# Patient Record
Sex: Male | Born: 1965 | Race: White | Hispanic: No | Marital: Married | Smoking: Former smoker
Health system: Southern US, Community
[De-identification: ages and names within clinical notes are randomized; demographics above are authoritative.]

## PROBLEM LIST (undated history)

## (undated) DIAGNOSIS — E785 Hyperlipidemia, unspecified: Secondary | ICD-10-CM

## (undated) DIAGNOSIS — I10 Essential (primary) hypertension: Secondary | ICD-10-CM

## (undated) HISTORY — PX: HERNIA REPAIR: SHX51

## (undated) HISTORY — DX: Hyperlipidemia, unspecified: E78.5

## (undated) HISTORY — PX: ANKLE FRACTURE SURGERY: SHX122

## (undated) HISTORY — DX: Essential (primary) hypertension: I10

---

## 1998-09-23 DIAGNOSIS — I10 Essential (primary) hypertension: Secondary | ICD-10-CM | POA: Insufficient documentation

## 1998-09-23 DIAGNOSIS — E7800 Pure hypercholesterolemia, unspecified: Secondary | ICD-10-CM

## 1998-09-23 HISTORY — DX: Pure hypercholesterolemia, unspecified: E78.00

## 2008-09-23 DIAGNOSIS — K649 Unspecified hemorrhoids: Secondary | ICD-10-CM

## 2008-09-23 DIAGNOSIS — I499 Cardiac arrhythmia, unspecified: Secondary | ICD-10-CM

## 2008-09-23 HISTORY — DX: Cardiac arrhythmia, unspecified: I49.9

## 2008-09-23 HISTORY — DX: Unspecified hemorrhoids: K64.9

## 2020-03-13 ENCOUNTER — Ambulatory Visit: Payer: 59 | Admitting: Family Medicine

## 2020-03-13 ENCOUNTER — Encounter: Payer: Self-pay | Admitting: Family Medicine

## 2020-03-13 ENCOUNTER — Other Ambulatory Visit: Payer: Self-pay

## 2020-03-13 VITALS — BP 148/98 | HR 67 | Temp 97.3°F | Ht 70.0 in | Wt 204.0 lb

## 2020-03-13 DIAGNOSIS — M7711 Lateral epicondylitis, right elbow: Secondary | ICD-10-CM

## 2020-03-13 DIAGNOSIS — K649 Unspecified hemorrhoids: Secondary | ICD-10-CM

## 2020-03-13 DIAGNOSIS — N4822 Cellulitis of corpus cavernosum and penis: Secondary | ICD-10-CM | POA: Insufficient documentation

## 2020-03-13 DIAGNOSIS — A6001 Herpesviral infection of penis: Secondary | ICD-10-CM | POA: Diagnosis not present

## 2020-03-13 MED ORDER — CEPHALEXIN 500 MG PO CAPS: 500.0000 mg | ORAL_CAPSULE | Freq: Three times a day (TID) | ORAL | 0 refills | Status: AC

## 2020-03-13 NOTE — Progress Notes (Addendum)
New Patient Office Visit  Subjective:  Patient ID: Phillip Gay, male    DOB: [birth year]  Age: 54 y.o. MRN: [id number]  CC:  Chief Complaint  Patient presents with  . Establish Care    New patient, possble hernia on right side of groan, right elbow pain that come and go. rocommendation for protologist, would like scab on penis checked.     HPI Phillip Gay presents for evaluation right elbow pain, rash on his penis, evaluation of inguinal hernias and intermittent hemorrhoid issues.  Patient is right-hand dominant with a 1 year history of right elbow pain.  This started after he was trimming some bushes.  He has had hernia repair but would like to be checked again.  He has had a rash on his penis for about a week now.  It was a little sore.  He has been married for 20 years and sexually active with his wife only.  no reason to think she has been active with anyone else.  History of hemorrhoids that have been injected by a proctologist in the past.  Past Medical History:  Diagnosis Date  . Hyperlipidemia   . Hypertension       Family History  Problem Relation Age of Onset  . Hypertension Father   . Cancer Father     Social History   Socioeconomic History  . Marital status: Married    Spouse name: Not on file  . Number of children: Not on file  . Years of education: Not on file  . Highest education level: Not on file  Occupational History  . Not on file  Tobacco Use  . Smoking status: Former Research scientist (life sciences)  . Smokeless tobacco: Never Used  Vaping Use  . Vaping Use: Never used  Substance and Sexual Activity  . Alcohol use: Yes    Alcohol/week: 2.0 standard drinks    Types: 1 Cans of beer, 1 Standard drinks or equivalent per week  . Drug use: Never  . Sexual activity: Yes  Other Topics Concern  . Not on file  Social History Narrative  . Not on file   Social Determinants of Health   Financial Resource Strain:   . Difficulty of Paying Living Expenses:    Food Insecurity:   . Worried About Charity fundraiser in the Last Year:   . Arboriculturist in the Last Year:   Transportation Needs:   . Film/video editor (Medical):   Marland Kitchen Lack of Transportation (Non-Medical):   Physical Activity:   . Days of Exercise per Week:   . Minutes of Exercise per Session:   Stress:   . Feeling of Stress :   Social Connections:   . Frequency of Communication with Friends and Family:   . Frequency of Social Gatherings with Friends and Family:   . Attends Religious Services:   . Active Member of Clubs or Organizations:   . Attends Archivist Meetings:   Marland Kitchen Marital Status:   Intimate Partner Violence:   . Fear of Current or Ex-Partner:   . Emotionally Abused:   Marland Kitchen Physically Abused:   . Sexually Abused:     ROS Review of Systems  Constitutional: Negative.   HENT: Negative.   Respiratory: Negative.   Cardiovascular: Negative.   Gastrointestinal: Negative.  Negative for anal bleeding, blood in stool and constipation.  Genitourinary: Negative.  Negative for difficulty urinating, discharge and frequency.  Musculoskeletal: Positive for arthralgias.  Skin: Negative for pallor  and rash.  Neurological: Negative for weakness and numbness.    Objective:   Today's Vitals: BP (!) 148/98   Pulse 67   Temp (!) 97.3 F (36.3 C) (Tympanic)   Ht 5\' 10"  (1.778 m)   Wt 204 lb (92.5 kg)   SpO2 94%   BMI 29.27 kg/m   Physical Exam Vitals and nursing note reviewed.  Constitutional:      General: He is not in acute distress.    Appearance: Normal appearance. He is normal weight. He is not ill-appearing or toxic-appearing.  HENT:     Head: Normocephalic and atraumatic.  Eyes:     Extraocular Movements: Extraocular movements intact.     Conjunctiva/sclera: Conjunctivae normal.  Neck:     Trachea: Trachea normal.  Pulmonary:     Effort: Pulmonary effort is normal.  Abdominal:     Hernia: There is no hernia in the left inguinal area or right  inguinal area.  Genitourinary:    Penis: Lesions present. No hypospadias, erythema, tenderness, discharge or swelling.      Testes:        Right: Mass, tenderness or swelling not present. Right testis is descended.        Left: Mass, tenderness or swelling not present. Left testis is descended.     Epididymis:     Right: Not inflamed or enlarged.     Left: Not inflamed or enlarged.     Rectum: External hemorrhoid present. No tenderness.    Musculoskeletal:     Cervical back: Full passive range of motion without pain.  Lymphadenopathy:     Lower Body: No right inguinal adenopathy. No left inguinal adenopathy.  Neurological:     Mental Status: He is alert.     Assessment & Plan:   Problem List Items Addressed This Visit      Cardiovascular and Mediastinum   Hemorrhoids - Primary   Relevant Medications   lisinopril (ZESTRIL) 20 MG tablet   propranolol (INNOPRAN XL) 80 MG 24 hr capsule   simvastatin (ZOCOR) 40 MG tablet     Musculoskeletal and Integument   Lateral epicondylitis of right elbow   Relevant Orders   Ambulatory referral to Sports Medicine     Genitourinary   Cellulitis of penis   Relevant Medications   cephALEXin (KEFLEX) 500 MG capsule   Other Relevant Orders   RPR (Completed)   Wound culture (Completed)   Herpes simplex virus culture (Completed)   Herpes simplex infection of penis   Relevant Medications   cephALEXin (KEFLEX) 500 MG capsule   valACYclovir (VALTREX) 1000 MG tablet   Other Relevant Orders   HSV 1 antibody, IgG   HSV 2 antibody, IgG   HSV Type I/II IgG, IgMw/ reflex      Outpatient Encounter Medications as of 03/13/2020  Medication Sig  . lisinopril (ZESTRIL) 20 MG tablet   . propranolol (INNOPRAN XL) 80 MG 24 hr capsule   . simvastatin (ZOCOR) 40 MG tablet   . cephALEXin (KEFLEX) 500 MG capsule Take 1 capsule (500 mg total) by mouth 3 (three) times daily for 10 days.  . valACYclovir (VALTREX) 1000 MG tablet Take 0.5 tablets (500 mg  total) by mouth 2 (two) times daily for 3 days.   No facility-administered encounter medications on file as of 03/13/2020.    Follow-up: Return if symptoms worsen or fail to improve.   Follow up for  Physical and recheck of bp.   03/15/2020, MD

## 2020-03-13 NOTE — Patient Instructions (Addendum)
Hemorrhoids Hemorrhoids are swollen veins in and around the rectum or anus. There are two types of hemorrhoids:  Internal hemorrhoids. These occur in the veins that are just inside the rectum. They may poke through to the outside and become irritated and painful.  External hemorrhoids. These occur in the veins that are outside the anus and can be felt as a painful swelling or hard lump near the anus. Most hemorrhoids do not cause serious problems, and they can be managed with home treatments such as diet and lifestyle changes. If home treatments do not help the symptoms, procedures can be done to shrink or remove the hemorrhoids. What are the causes? This condition is caused by increased pressure in the anal area. This pressure may result from various things, including:  Constipation.  Straining to have a bowel movement.  Diarrhea.  Pregnancy.  Obesity.  Sitting for long periods of time.  Heavy lifting or other activity that causes you to strain.  Anal sex.  Riding a bike for a long period of time. What are the signs or symptoms? Symptoms of this condition include:  Pain.  Anal itching or irritation.  Rectal bleeding.  Leakage of stool (feces).  Anal swelling.  One or more lumps around the anus. How is this diagnosed? This condition can often be diagnosed through a visual exam. Other exams or tests may also be done, such as:  An exam that involves feeling the rectal area with a gloved hand (digital rectal exam).  An exam of the anal canal that is done using a small tube (anoscope).  A blood test, if you have lost a significant amount of blood.  A test to look inside the colon using a flexible tube with a camera on the end (sigmoidoscopy or colonoscopy). How is this treated? This condition can usually be treated at home. However, various procedures may be done if dietary changes, lifestyle changes, and other home treatments do not help your symptoms. These  procedures can help make the hemorrhoids smaller or remove them completely. Some of these procedures involve surgery, and others do not. Common procedures include:  Rubber band ligation. Rubber bands are placed at the base of the hemorrhoids to cut off their blood supply.  Sclerotherapy. Medicine is injected into the hemorrhoids to shrink them.  Infrared coagulation. A type of light energy is used to get rid of the hemorrhoids.  Hemorrhoidectomy surgery. The hemorrhoids are surgically removed, and the veins that supply them are tied off.  Stapled hemorrhoidopexy surgery. The surgeon staples the base of the hemorrhoid to the rectal wall. Follow these instructions at home: Eating and drinking   Eat foods that have a lot of fiber in them, such as whole grains, beans, nuts, fruits, and vegetables.  Ask your health care provider about taking products that have added fiber (fiber supplements).  Reduce the amount of fat in your diet. You can do this by eating low-fat dairy products, eating less red meat, and avoiding processed foods.  Drink enough fluid to keep your urine pale yellow. Managing pain and swelling   Take warm sitz baths for 20 minutes, 3-4 times a day to ease pain and discomfort. You may do this in a bathtub or using a portable sitz bath that fits over the toilet.  If directed, apply ice to the affected area. Using ice packs between sitz baths may be helpful. ? Put ice in a plastic bag. ? Place a towel between your skin and the bag. ? Leave  the ice on for 20 minutes, 2-3 times a day. General instructions  Take over-the-counter and prescription medicines only as told by your health care provider.  Use medicated creams or suppositories as told.  Get regular exercise. Ask your health care provider how much and what kind of exercise is best for you. In general, you should do moderate exercise for at least 30 minutes on most days of the week (150 minutes each week). This can  include activities such as walking, biking, or yoga.  Go to the bathroom when you have the urge to have a bowel movement. Do not wait.  Avoid straining to have bowel movements.  Keep the anal area dry and clean. Use wet toilet paper or moist towelettes after a bowel movement.  Do not sit on the toilet for long periods of time. This increases blood pooling and pain.  Keep all follow-up visits as told by your health care provider. This is important. Contact a health care provider if you have:  Increasing pain and swelling that are not controlled by treatment or medicine.  Difficulty having a bowel movement, or you are unable to have a bowel movement.  Pain or inflammation outside the area of the hemorrhoids. Get help right away if you have:  Uncontrolled bleeding from your rectum. Summary  Hemorrhoids are swollen veins in and around the rectum or anus.  Most hemorrhoids can be managed with home treatments such as diet and lifestyle changes.  Taking warm sitz baths can help ease pain and discomfort.  In severe cases, procedures or surgery can be done to shrink or remove the hemorrhoids. This information is not intended to replace advice given to you by your health care provider. Make sure you discuss any questions you have with your health care provider. Document Revised: 02/05/2019 Document Reviewed: 01/29/2018 Elsevier Patient Education  2020 [employer].  Tennis Elbow  Tennis elbow (lateral epicondylitis) is inflammation of tendons in your outer forearm, near your elbow. Tendons are tissues that connect muscle to bone. When you have tennis elbow, inflammation affects the tendons that you use to bend your wrist and move your hand up. Inflammation occurs in the lower part of the upper arm bone (humerus), where the tendons connect to the bone (lateral epicondyle). Tennis elbow often affects people who play tennis, but anyone may get the condition from repeatedly extending the  wrist or turning the forearm. What are the causes? This condition is usually caused by repeatedly extending the wrist, turning the forearm, and using the hands. It can result from sports or work that requires repetitive forearm movements. In some cases, it may be caused by a sudden injury. What increases the risk? You are more likely to develop tennis elbow if you play tennis or another racket sport. You also have a higher risk if you frequently use your hands for work. Besides people who play tennis, others at greater risk include:  Musicians.  Carpenters, painters, and plumbers.  Cooks.  Cashiers.  People who work in [employer].  Holiday representative workers.  Butchers.  People who use computers. What are the signs or symptoms? Symptoms of this condition include:  Pain and tenderness in the forearm and the outer part of the elbow. Pain may be felt only when using the arm, or it may be there all the time.  A burning feeling that starts in the elbow and spreads down the forearm.  A weak grip in the hand. How is this diagnosed? This condition may be diagnosed  based on:  Your symptoms and medical history.  A physical exam.  X-rays.  MRI. How is this treated? Resting and icing your arm is often the first treatment. Your health care provider may also recommend:  Medicines to reduce pain and inflammation. These may be in the form of a pill, topical gels, or shots of a steroid medicine (cortisone).  An elbow strap to reduce stress on the area.  Physical therapy. This may include massage or exercises.  An elbow brace to restrict the movements that cause symptoms. If these treatments do not help relieve your symptoms, your health care provider may recommend surgery to remove damaged muscle and reattach healthy muscle to bone. Follow these instructions at home: Activity  Rest your elbow and wrist and avoid activities that cause symptoms, as told by your health care provider.  Do  physical therapy exercises as instructed.  If you lift an object, lift it with your palm facing up. This reduces stress on your elbow. Lifestyle  If your tennis elbow is caused by sports, check your equipment and make sure that: ? You are using it correctly. ? It is the best fit for you.  If your tennis elbow is caused by work or computer use, take frequent breaks to stretch your arm. Talk with your manager about ways to manage your condition at work. If you have a brace:  Wear the brace or strap as told by your health care provider. Remove it only as told by your health care provider.  Loosen the brace if your fingers tingle, become numb, or turn cold and blue.  Keep the brace clean.  If the brace is not waterproof, ask if you may remove it for bathing. If you must keep the brace on while bathing: ? Do not let it get wet. ? Cover it with a watertight covering when you take a bath or a shower. General instructions   If directed, put ice on the painful area: ? Put ice in a plastic bag. ? Place a towel between your skin and the bag. ? Leave the ice on for 20 minutes, 2-3 times a day.  Take over-the-counter and prescription medicines only as told by your health care provider.  Keep all follow-up visits as told by your health care provider. This is important. Contact a health care provider if:  You have pain that gets worse or does not get better with treatment.  You have numbness or weakness in your forearm, hand, or fingers. Summary  Tennis elbow (lateral epicondylitis) is inflammation of tendons in your outer forearm, near your elbow.  Common symptoms include pain and tenderness in your forearm and the outer part of your elbow.  This condition is usually caused by repeatedly extending your wrist, turning your forearm, and using your hands.  The first treatment is often resting and icing your arm to relieve symptoms. Further treatment may include taking medicine, getting  physical therapy, wearing a brace or strap, or having surgery. This information is not intended to replace advice given to you by your health care provider. Make sure you discuss any questions you have with your health care provider. Document Revised: 06/05/2018 Document Reviewed: 06/24/2017 Elsevier Patient Education  2020 [employer].  Tennis Elbow Rehab Ask your health care provider which exercises are safe for you. Do exercises exactly as told by your health care provider and adjust them as directed. It is normal to feel mild stretching, pulling, tightness, or discomfort as you do these exercises.  Stop right away if you feel sudden pain or your pain gets worse. Do not begin these exercises until told by your health care provider. Stretching and range-of-motion exercises These exercises warm up your muscles and joints and improve the movement and flexibility of your elbow. These exercises also help to relieve pain, numbness, and tingling. Wrist flexion, assisted  1. Straighten your left / right elbow in front of you with your palm facing down toward the floor. ? If told by your health care provider, bend your left / right elbow to a 90-degree angle (right angle) at your side. 2. With your other hand, gently push over the back of your left / right hand so your fingers point toward the floor (flexion). Stop when you feel a gentle stretch on the back of your forearm. 3. Hold this position for __________ seconds. Repeat __________ times. Complete this exercise __________ times a day. Wrist extension, assisted  1. Straighten your left / right elbow in front of you with your palm facing up toward the ceiling. ? If told by your health care provider, bend your left / right elbow to a 90-degree angle (right angle) at your side. 2. With your other hand, gently pull your left / right hand and fingers toward the floor (extension). Stop when you feel a gentle stretch on the palm side of your  forearm. 3. Hold this position for __________ seconds. Repeat __________ times. Complete this exercise __________ times a day. Assisted forearm rotation, supination 1. Sit or stand with your left / right elbow bent to a 90-degree angle (right angle) at your side. 2. Using your uninjured hand, turn (rotate) your left / right palm up toward the ceiling (supination) until you feel a gentle stretch along the inside of your forearm. 3. Hold this position for __________ seconds. Repeat __________ times. Complete this exercise __________ times a day. Assisted forearm rotation, pronation 1. Sit or stand with your left / right elbow bent to a 90-degree angle (right angle) at your side. 2. Using your uninjured hand, rotate your left / right palm down toward the floor (pronation) until you feel a gentle stretch along the outside of your forearm. 3. Hold this position for __________ seconds. Repeat __________ times. Complete this exercise __________ times a day. Strengthening exercises These exercises build strength and endurance in your forearm and elbow. Endurance is the ability to use your muscles for a long time, even after they get tired. Radial deviation  1. Stand with a __________ weight or a hammer in your left / right hand. Or, sit while holding a rubber exercise band or tubing, with your left / right forearm supported on a table or countertop. ? If you are standing, position your forearm so that your thumb is facing forward. If you are sitting, position your forearm so that the thumb is facing the ceiling. This is the neutral position. 2. Raise your hand upward in front of you so your thumb moves toward the ceiling (radial deviation), or pull up on the rubber tubing. Keep your forearm and elbow still while you move your wrist only. 3. Hold this position for __________ seconds. 4. Slowly return to the starting position. Repeat __________ times. Complete this exercise __________ times a day. Wrist  extension, eccentric 1. Sit with your left / right forearm palm-down and supported on a table or other surface. Let your left / right wrist extend over the edge of the surface. 2. Hold a __________ weight or a piece of  exercise band or tubing in your left / right hand. ? If using a rubber exercise band or tubing, hold the other end of the tubing with your other hand. 3. Use your uninjured hand to move your left / right hand up toward the ceiling. 4. Take your uninjured hand away and slowly return to the starting position using only your left / right hand. Lowering your arm under tension is called eccentric extension. Repeat __________ times. Complete this exercise __________ times a day. Wrist extension Do not do this exercise if it causes pain at the outside of your elbow. Only do this exercise once instructed by your health care provider. 1. Sit with your left / right forearm supported on a table or other surface and your palm turned down toward the floor. Let your left / right wrist extend over the edge of the surface. 2. Hold a __________ weight or a piece of rubber exercise band or tubing. ? If you are using a rubber exercise band or tubing, hold the band or tubing in place with your other hand to provide resistance. 3. Slowly bend your wrist so your hand moves up toward the ceiling (extension). Move only your wrist, keeping your forearm and elbow still. 4. Hold this position for __________ seconds. 5. Slowly return to the starting position. Repeat __________ times. Complete this exercise __________ times a day. Forearm rotation, supination To do this exercise, you will need a lightweight hammer or rubber mallet. 1. Sit with your left / right forearm supported on a table or other surface. Bend your elbow to a 90-degree angle (right angle). Position your forearm so that your palm is facing down toward the floor, with your hand resting over the edge of the table. 2. Hold a hammer in your left /  right hand. ? To make this exercise easier, hold the hammer near the head of the hammer. ? To make this exercise harder, hold the hammer near the end of the handle. 3. Without moving your wrist or elbow, slowly rotate your forearm so your palm faces up toward the ceiling (supination). 4. Hold this position for __________ seconds. 5. Slowly return to the starting position. Repeat __________ times. Complete this exercise __________ times a day. Shoulder blade squeeze 1. Sit in a stable chair or stand with good posture. If you are sitting down, do not let your back touch the back of the chair. 2. Your arms should be at your sides with your elbows bent to a 90-degree angle (right angle). Position your forearms so that your thumbs are facing the ceiling (neutral position). 3. Without lifting your shoulders up, squeeze your shoulder blades tightly together. 4. Hold this position for __________ seconds. 5. Slowly release and return to the starting position. Repeat __________ times. Complete this exercise __________ times a day. This information is not intended to replace advice given to you by your health care provider. Make sure you discuss any questions you have with your health care provider. Document Revised: 12/31/2018 Document Reviewed: 11/03/2018 Elsevier Patient Education  [place].

## 2020-03-14 NOTE — Addendum Note (Signed)
Addended by: Nadene Rubins A on: 03/14/2020 11:05 AM   Modules accepted: Orders

## 2020-03-15 LAB — RPR: RPR Ser Ql: NONREACTIVE

## 2020-03-17 DIAGNOSIS — A6001 Herpesviral infection of penis: Secondary | ICD-10-CM | POA: Insufficient documentation

## 2020-03-17 MED ORDER — VALACYCLOVIR HCL 1 G PO TABS: 500.0000 mg | ORAL_TABLET | Freq: Two times a day (BID) | ORAL | 2 refills | Status: AC

## 2020-03-17 NOTE — Addendum Note (Signed)
Addended by: Andrez Grime on: 03/17/2020 03:02 PM   Modules accepted: Orders

## 2020-03-18 LAB — HERPES SIMPLEX VIRUS CULTURE
MICRO NUMBER:: 10619807
SPECIMEN QUALITY:: ADEQUATE

## 2020-03-18 LAB — WOUND CULTURE
GRAM STAIN:: NONE SEEN
MICRO NUMBER:: 10619808
SPECIMEN QUALITY:: ADEQUATE

## 2020-03-20 ENCOUNTER — Ambulatory Visit: Payer: Self-pay

## 2020-03-20 ENCOUNTER — Ambulatory Visit (INDEPENDENT_AMBULATORY_CARE_PROVIDER_SITE_OTHER): Payer: 59 | Admitting: Family Medicine

## 2020-03-20 ENCOUNTER — Encounter: Payer: Self-pay | Admitting: Family Medicine

## 2020-03-20 ENCOUNTER — Other Ambulatory Visit: Payer: Self-pay

## 2020-03-20 VITALS — BP 136/87 | HR 66 | Ht 70.0 in | Wt 200.0 lb

## 2020-03-20 DIAGNOSIS — M7711 Lateral epicondylitis, right elbow: Secondary | ICD-10-CM | POA: Diagnosis not present

## 2020-03-20 MED ORDER — METHYLPREDNISOLONE ACETATE 40 MG/ML IJ SUSP
40.0000 mg | Freq: Once | INTRAMUSCULAR | Status: AC
  Administered 2020-03-20: 40 mg via INTRA_ARTICULAR

## 2020-03-20 NOTE — Patient Instructions (Signed)
Nice to meet you Please try ice  Please try the exercises  Please try adjusting how you work on the computer   Please send me a message in MyChart with any questions or updates.  Please see me back in 4 weeks.   --Dr. Jordan Likes

## 2020-03-20 NOTE — Assessment & Plan Note (Signed)
Findings on clinical exam and ultrasound would point to lateral epicondylitis. -Counseled on home exercise therapy and supportive care. -Injection. -Could consider physical therapy or nitro patches.

## 2020-03-20 NOTE — Progress Notes (Signed)
Phillip Gay - 54 y.o. male MRN [id number]  Date of birth: [birth year]  SUBJECTIVE:  Including CC & ROS.  Chief Complaint  Patient presents with  . Elbow Pain    right    Phillip Gay is a 54 y.o. male that is presenting with acute on chronic right elbow pain.  Pain is been ongoing for roughly 10 months.He feels that he started after trimming some hedges.  Has tried over-the-counter measures with limited improvement.  Denies any mechanical symptoms.  No prior history of surgery.   Review of Systems See HPI   HISTORY: Past Medical, Surgical, Social, and Family History Reviewed & Updated per EMR.   Pertinent Historical Findings include:  Past Medical History:  Diagnosis Date  . Hyperlipidemia   . Hypertension     Past Surgical History:  Procedure Laterality Date  . ANKLE FRACTURE SURGERY Left   . HERNIA REPAIR      Family History  Problem Relation Age of Onset  . Hypertension Father   . Cancer Father     Social History   Socioeconomic History  . Marital status: Married    Spouse name: Not on file  . Number of children: Not on file  . Years of education: Not on file  . Highest education level: Not on file  Occupational History  . Not on file  Tobacco Use  . Smoking status: Former Games developer  . Smokeless tobacco: Never Used  Vaping Use  . Vaping Use: Never used  Substance and Sexual Activity  . Alcohol use: Yes    Alcohol/week: 2.0 standard drinks    Types: 1 Cans of beer, 1 Standard drinks or equivalent per week  . Drug use: Never  . Sexual activity: Yes  Other Topics Concern  . Not on file  Social History Narrative  . Not on file   Social Determinants of Health   Financial Resource Strain:   . Difficulty of Paying Living Expenses:   Food Insecurity:   . Worried About Programme researcher, broadcasting/film/video in the Last Year:   . Barista in the Last Year:   Transportation Needs:   . Freight forwarder (Medical):   Marland Kitchen Lack of Transportation  (Non-Medical):   Physical Activity:   . Days of Exercise per Week:   . Minutes of Exercise per Session:   Stress:   . Feeling of Stress :   Social Connections:   . Frequency of Communication with Friends and Family:   . Frequency of Social Gatherings with Friends and Family:   . Attends Religious Services:   . Active Member of Clubs or Organizations:   . Attends Banker Meetings:   Marland Kitchen Marital Status:   Intimate Partner Violence:   . Fear of Current or Ex-Partner:   . Emotionally Abused:   Marland Kitchen Physically Abused:   . Sexually Abused:      PHYSICAL EXAM:  VS: BP 136/87   Pulse 66   Ht 5\' 10"  (1.778 m)   Wt 200 lb (90.7 kg)   BMI 28.70 kg/m  Physical Exam Gen: NAD, alert, cooperative with exam, well-appearing MSK:  Right elbow: Tenderness to palpation over the lateral condyle. Normal range of motion. Normal strength resistance. No swelling or ecchymosis. Neurovascular intact  Limited ultrasound: Right elbow:  Increased hyperemia of the lateral epicondyle. No joint effusion.  Summary: Findings would suggest lateral epicondylitis   Ultrasound and interpretation by , MD   Aspiration/Injection Procedure Note Phillip Gay  Phillip Gay 11/05/1965  Procedure: Injection Indications: Right elbow pain  Procedure Details Consent: Risks of procedure as well as the alternatives and risks of each were explained to the (patient/caregiver).  Consent for procedure obtained. Time Out: Verified patient identification, verified procedure, site/side was marked, verified correct patient position, special equipment/implants available, medications/allergies/relevent history reviewed, required imaging and test results available.  Performed.  The area was cleaned with iodine and alcohol swabs.    The right lateral condyle was injected using 1 cc's of 40 mg Depo-Medrol and 3 cc's of 0.25% bupivacaine with a 25 1 1/2" needle.  Ultrasound was used. Images were obtained in  long views showing the injection.     A sterile dressing was applied.  Patient did tolerate procedure well.     ASSESSMENT & PLAN:   Lateral epicondylitis of right elbow Findings on clinical exam and ultrasound would point to lateral epicondylitis. -Counseled on home exercise therapy and supportive care. -Injection. -Could consider physical therapy or nitro patches.

## 2020-04-13 ENCOUNTER — Ambulatory Visit: Payer: Self-pay | Admitting: Family Medicine

## 2020-04-13 ENCOUNTER — Other Ambulatory Visit: Payer: 59

## 2020-04-18 ENCOUNTER — Ambulatory Visit: Payer: 59 | Admitting: Family Medicine

## 2020-06-21 ENCOUNTER — Other Ambulatory Visit: Payer: Self-pay

## 2020-06-22 ENCOUNTER — Other Ambulatory Visit (INDEPENDENT_AMBULATORY_CARE_PROVIDER_SITE_OTHER): Payer: 59

## 2020-06-22 DIAGNOSIS — A6001 Herpesviral infection of penis: Secondary | ICD-10-CM

## 2020-06-22 NOTE — Addendum Note (Signed)
Addended by: Varney Biles on: 06/22/2020 01:08 PM   Modules accepted: Orders

## 2020-06-23 LAB — HSV 2 ANTIBODY, IGG: HSV 2 Glycoprotein G Ab, IgG: >23 index — ABNORMAL HIGH

## 2020-06-23 LAB — HSV 1 ANTIBODY, IGG: HSV 1 Glycoprotein G Ab, IgG: <0.9 index

## 2020-06-24 LAB — HSV(HERPES SIMPLEX VRS) I + II AB-IGG
HSV 1 Glycoprotein G Ab, IgG: <0.91 index (ref 0.00–0.90)
HSV 2 IgG, Type Spec: 18.6 index — ABNORMAL HIGH (ref 0.00–0.90)

## 2020-06-24 LAB — SPECIMEN STATUS REPORT

## 2020-10-04 ENCOUNTER — Telehealth: Payer: Self-pay | Admitting: Family Medicine

## 2020-10-04 MED ORDER — PROPRANOLOL HCL ER BEADS 80 MG PO CP24: 80.0000 mg | ORAL_CAPSULE | Freq: Every day | ORAL | 1 refills | Status: DC

## 2020-10-04 MED ORDER — LISINOPRIL 20 MG PO TABS: 20.0000 mg | ORAL_TABLET | Freq: Every day | ORAL | 1 refills | Status: DC

## 2020-10-04 MED ORDER — SIMVASTATIN 40 MG PO TABS
40.0000 mg | ORAL_TABLET | Freq: Every day | ORAL | 1 refills | Status: DC
Start: 2020-10-04 — End: 2020-12-11

## 2020-10-04 NOTE — Telephone Encounter (Signed)
Pt called and said that he has an appt scheduled with Dr Doreene Burke on 11/15/20, but his prescriptions are expired and he wanted to know if they could be filled until he comes in for his appt. Lisinopril, propranolol and simvastatin. [facility]  [address], [place], [place] [address]

## 2020-10-05 ENCOUNTER — Other Ambulatory Visit: Payer: Self-pay | Admitting: Family

## 2020-11-15 ENCOUNTER — Ambulatory Visit: Payer: 59 | Admitting: Family Medicine

## 2020-12-07 ENCOUNTER — Other Ambulatory Visit: Payer: Self-pay

## 2020-12-08 ENCOUNTER — Encounter: Payer: Self-pay | Admitting: Family Medicine

## 2020-12-08 ENCOUNTER — Ambulatory Visit (INDEPENDENT_AMBULATORY_CARE_PROVIDER_SITE_OTHER): Payer: 59 | Admitting: Family Medicine

## 2020-12-08 VITALS — BP 120/78 | HR 61 | Temp 97.0°F | Ht 70.0 in | Wt 182.8 lb

## 2020-12-08 DIAGNOSIS — Z87898 Personal history of other specified conditions: Secondary | ICD-10-CM | POA: Insufficient documentation

## 2020-12-08 DIAGNOSIS — Z0000 Encounter for general adult medical examination without abnormal findings: Secondary | ICD-10-CM

## 2020-12-08 DIAGNOSIS — I10 Essential (primary) hypertension: Secondary | ICD-10-CM

## 2020-12-08 DIAGNOSIS — E7800 Pure hypercholesterolemia, unspecified: Secondary | ICD-10-CM | POA: Diagnosis not present

## 2020-12-08 DIAGNOSIS — R002 Palpitations: Secondary | ICD-10-CM

## 2020-12-08 DIAGNOSIS — R011 Cardiac murmur, unspecified: Secondary | ICD-10-CM

## 2020-12-08 HISTORY — DX: Essential (primary) hypertension: I10

## 2020-12-08 HISTORY — DX: Encounter for general adult medical examination without abnormal findings: Z00.00

## 2020-12-08 HISTORY — DX: Palpitations: R00.2

## 2020-12-08 HISTORY — DX: Personal history of other specified conditions: Z87.898

## 2020-12-08 LAB — URINALYSIS, ROUTINE W REFLEX MICROSCOPIC
Bilirubin Urine: NEGATIVE
Hgb urine dipstick: NEGATIVE
Ketones, ur: NEGATIVE
Leukocytes,Ua: NEGATIVE
Nitrite: NEGATIVE
RBC / HPF: NONE SEEN (ref 0–?)
Specific Gravity, Urine: 1.025 (ref 1.000–1.030)
Total Protein, Urine: NEGATIVE
Urine Glucose: NEGATIVE
Urobilinogen, UA: 0.2 (ref 0.0–1.0)
pH: 5.5 (ref 5.0–8.0)

## 2020-12-08 LAB — COMPREHENSIVE METABOLIC PANEL
ALT: 24 U/L (ref 0–53)
AST: 21 U/L (ref 0–37)
Albumin: 5 g/dL (ref 3.5–5.2)
Alkaline Phosphatase: 48 U/L (ref 39–117)
BUN: 14 mg/dL (ref 6–23)
CO2: 29 mEq/L (ref 19–32)
Calcium: 10.4 mg/dL (ref 8.4–10.5)
Chloride: 102 mEq/L (ref 96–112)
Creatinine, Ser: 0.85 mg/dL (ref 0.40–1.50)
GFR: 98.7 mL/min (ref >60.00)
Glucose, Bld: 97 mg/dL (ref 70–99)
Potassium: 4.9 mEq/L (ref 3.5–5.1)
Sodium: 140 mEq/L (ref 135–145)
Total Bilirubin: 0.7 mg/dL (ref 0.2–1.2)
Total Protein: 7.7 g/dL (ref 6.0–8.3)

## 2020-12-08 LAB — CBC
HCT: 47.8 % (ref 39.0–52.0)
Hemoglobin: 16.3 g/dL (ref 13.0–17.0)
MCHC: 34 g/dL (ref 30.0–36.0)
MCV: 97.4 fl (ref 78.0–100.0)
Platelets: 266 10*3/uL (ref 150.0–400.0)
RBC: 4.91 Mil/uL (ref 4.22–5.81)
RDW: 12.9 % (ref 11.5–15.5)
WBC: 6.9 10*3/uL (ref 4.0–10.5)

## 2020-12-08 LAB — LIPID PANEL
Cholesterol: 199 mg/dL (ref 0–200)
HDL: 38.6 mg/dL — ABNORMAL LOW (ref >39.00)
LDL Cholesterol: 122 mg/dL — ABNORMAL HIGH (ref 0–99)
NonHDL: 160.39
Total CHOL/HDL Ratio: 5
Triglycerides: 193 mg/dL — ABNORMAL HIGH (ref 0.0–149.0)
VLDL: 38.6 mg/dL (ref 0.0–40.0)

## 2020-12-08 LAB — PSA: PSA: 1.11 ng/mL (ref 0.10–4.00)

## 2020-12-08 LAB — TSH: TSH: 1.23 u[IU]/mL (ref 0.35–4.50)

## 2020-12-08 NOTE — Progress Notes (Addendum)
Established Patient Office Visit  Subjective:  Patient ID: Phillip Gay, male    DOB: [birth year]  Age: 55 y.o. MRN: [id number]  CC:  Chief Complaint  Patient presents with  . Follow-up    Follow up on medications and labs, discuss irregular heart beat.    HPI Phillip Gay presents for physical exam and follow-up of hypertension, history of palpitations and elevated cholesterol.  Has been on extended release propranolol, Zocor and lisinopril for years now with good effect.  Blood pressures been well controlled.  Rarely has palpitations when he forgets to take his propranolol.  Minimal alcohol use and does not use illicit drugs.  Stop smoking over 30 years ago.  Patient has intentional weight loss of 20 pounds.  He has been eating healthy and going to the gym with his wife.  Colonoscopy 4 years ago.  Past Medical History:  Diagnosis Date  . Hyperlipidemia   . Hypertension     Past Surgical History:  Procedure Laterality Date  . ANKLE FRACTURE SURGERY Left   . HERNIA REPAIR      Family History  Problem Relation Age of Onset  . Hypertension Father   . Cancer Father     Social History   Socioeconomic History  . Marital status: Married    Spouse name: Not on file  . Number of children: Not on file  . Years of education: Not on file  . Highest education level: Not on file  Occupational History  . Not on file  Tobacco Use  . Smoking status: Former Research scientist (life sciences)  . Smokeless tobacco: Never Used  Vaping Use  . Vaping Use: Never used  Substance and Sexual Activity  . Alcohol use: Yes    Alcohol/week: 2.0 standard drinks    Types: 1 Cans of beer, 1 Standard drinks or equivalent per week  . Drug use: Never  . Sexual activity: Yes  Other Topics Concern  . Not on file  Social History Narrative  . Not on file   Social Determinants of Health   Financial Resource Strain: Not on file  Food Insecurity: Not on file  Transportation Needs: Not on file  Physical  Activity: Not on file  Stress: Not on file  Social Connections: Not on file  Intimate Partner Violence: Not on file    Outpatient Medications Prior to Visit  Medication Sig Dispense Refill  . INNOPRAN XL 80 MG 24 hr capsule TAKE 1 CAPSULE (80 MG TOTAL) BY MOUTH AT BEDTIME. 90 capsule 1  . lisinopril (ZESTRIL) 20 MG tablet Take 1 tablet (20 mg total) by mouth daily. 90 tablet 1  . simvastatin (ZOCOR) 40 MG tablet Take 1 tablet (40 mg total) by mouth daily at 6 PM. 90 tablet 1   No facility-administered medications prior to visit.    No Known Allergies  ROS Review of Systems  Constitutional: Negative.   HENT: Negative.   Eyes: Negative for photophobia and visual disturbance.  Respiratory: Negative.  Negative for chest tightness and shortness of breath.   Cardiovascular: Positive for palpitations. Negative for chest pain.  Gastrointestinal: Negative for anal bleeding, blood in stool and constipation.  Endocrine: Negative for polyphagia and polyuria.  Genitourinary: Negative for difficulty urinating, frequency and urgency.  Musculoskeletal: Negative.   Skin: Negative for color change and pallor.  Allergic/Immunologic: Negative for immunocompromised state.  Neurological: Negative.   Psychiatric/Behavioral: Negative.       Objective:    Physical Exam Vitals and nursing note reviewed.  Constitutional:      General: He is not in acute distress.    Appearance: Normal appearance. He is normal weight. He is not ill-appearing, toxic-appearing or diaphoretic.  HENT:     Head: Normocephalic and atraumatic.     Right Ear: Tympanic membrane, ear canal and external ear normal.     Left Ear: Tympanic membrane and external ear normal.  Cardiovascular:     Rate and Rhythm: Normal rate and regular rhythm.     Heart sounds: Murmur heard.   Systolic murmur is present with a grade of 2/6.     Comments: Murmur loudest at apex.  Pulmonary:     Effort: Pulmonary effort is normal.      Breath sounds: Normal breath sounds.  Abdominal:     General: Abdomen is flat. Bowel sounds are normal. There is no distension.     Palpations: Abdomen is soft. There is no mass.     Tenderness: There is no abdominal tenderness. There is no guarding or rebound.     Hernia: No hernia is present. There is no hernia in the left inguinal area or right inguinal area.  Genitourinary:    Pubic Area: No rash.      Penis: Normal. No hypospadias, erythema, tenderness, discharge, swelling or lesions.      Testes:        Right: Mass, tenderness or swelling not present. Right testis is descended.        Left: Mass, tenderness or swelling not present. Left testis is descended.     Epididymis:     Right: Not inflamed.     Left: Not inflamed.     Prostate: Enlarged. Not tender and no nodules present.     Rectum: Guaiac result negative. External hemorrhoid present. No mass, tenderness, anal fissure or internal hemorrhoid. Normal anal tone.  Musculoskeletal:     Cervical back: No rigidity or tenderness.     Right lower leg: No edema.     Left lower leg: No edema.  Lymphadenopathy:     Cervical: No cervical adenopathy.     Lower Body: No right inguinal adenopathy.  Skin:    General: Skin is warm and dry.  Neurological:     Mental Status: He is alert and oriented to person, place, and time.  Psychiatric:        Mood and Affect: Mood normal.        Behavior: Behavior normal.     BP 120/78   Pulse 61   Temp (!) 97 F (36.1 C) (Temporal)   Ht 5' 10"  (1.778 m)   Wt 182 lb 12.8 oz (82.9 kg)   SpO2 96%   BMI 26.23 kg/m  Wt Readings from Last 3 Encounters:  12/08/20 182 lb 12.8 oz (82.9 kg)  03/20/20 200 lb (90.7 kg)  03/13/20 204 lb (92.5 kg)   The 10-year ASCVD risk score Mikey Bussing DC Jr., et al., 2013) is: 6.7%   Values used to calculate the score:     Age: 88 years     Sex: Male     Is Non-Hispanic African American: No     Diabetic: No     Tobacco smoker: No     Systolic Blood Pressure:  120 mmHg     Is BP treated: Yes     HDL Cholesterol: 38.6 mg/dL     Total Cholesterol: 199 mg/dL  Health Maintenance Due  Topic Date Due  . Hepatitis C Screening  Never done  .  HIV Screening  Never done    There are no preventive care reminders to display for this patient.  No results found for: TSH No results found for: WBC, HGB, HCT, MCV, PLT No results found for: NA, K, CHLORIDE, CO2, GLUCOSE, BUN, CREATININE, BILITOT, ALKPHOS, AST, ALT, PROT, ALBUMIN, CALCIUM, ANIONGAP, EGFR, GFR No results found for: CHOL No results found for: HDL No results found for: LDLCALC No results found for: TRIG No results found for: CHOLHDL No results found for: HGBA1C    Assessment & Plan:   Problem List Items Addressed This Visit      Cardiovascular and Mediastinum   Essential hypertension - Primary   Relevant Orders   CBC   Comprehensive metabolic panel   TSH   Urinalysis, Routine w reflex microscopic     Other   History of weight loss   Relevant Orders   TSH   Healthcare maintenance   Relevant Orders   PSA   Elevated cholesterol   Relevant Orders   Comprehensive metabolic panel   Lipid panel   Palpitations   Relevant Orders   TSH    Other Visit Diagnoses    Heart murmur       Relevant Orders   Ambulatory referral to Cardiology      No orders of the defined types were placed in this encounter.   Follow-up: Return in about 6 months (around 06/10/2021).  Patient was encouraged to continue his healthy lifestyle.  Continue all current medications.  Cardiology referral for echocardiogram to assess murmur.  Was given information on health maintenance and disease prevention.  Also given information on heart murmurs and palpitations.  Libby Maw, MD

## 2020-12-08 NOTE — Patient Instructions (Signed)
Health Maintenance, Male Adopting a healthy lifestyle and getting preventive care are important in promoting health and wellness. Ask your health care provider about:  The right schedule for you to have regular tests and exams.  Things you can do on your own to prevent diseases and keep yourself healthy. What should I know about diet, weight, and exercise? Eat a healthy diet  Eat a diet that includes plenty of vegetables, fruits, low-fat dairy products, and lean protein.  Do not eat a lot of foods that are high in solid fats, added sugars, or sodium.   Maintain a healthy weight Body mass index (BMI) is a measurement that can be used to identify possible weight problems. It estimates body fat based on height and weight. Your health care provider can help determine your BMI and help you achieve or maintain a healthy weight. Get regular exercise Get regular exercise. This is one of the most important things you can do for your health. Most adults should:  Exercise for at least 150 minutes each week. The exercise should increase your heart rate and make you sweat (moderate-intensity exercise).  Do strengthening exercises at least twice a week. This is in addition to the moderate-intensity exercise.  Spend less time sitting. Even light physical activity can be beneficial. Watch cholesterol and blood lipids Have your blood tested for lipids and cholesterol at 55 years of age, then have this test every 5 years. You may need to have your cholesterol levels checked more often if:  Your lipid or cholesterol levels are high.  You are older than 55 years of age.  You are at high risk for heart disease. What should I know about cancer screening? Many types of cancers can be detected early and may often be prevented. Depending on your health history and family history, you may need to have cancer screening at various ages. This may include screening for:  Colorectal cancer.  Prostate  cancer.  Skin cancer.  Lung cancer. What should I know about heart disease, diabetes, and high blood pressure? Blood pressure and heart disease  High blood pressure causes heart disease and increases the risk of stroke. This is more likely to develop in people who have high blood pressure readings, are of African descent, or are overweight.  Talk with your health care provider about your target blood pressure readings.  Have your blood pressure checked: ? Every 3-5 years if you are 18-39 years of age. ? Every year if you are 40 years old or older.  If you are between the ages of 65 and 75 and are a current or former smoker, ask your health care provider if you should have a one-time screening for abdominal aortic aneurysm (AAA). Diabetes Have regular diabetes screenings. This checks your fasting blood sugar level. Have the screening done:  Once every three years after age 45 if you are at a normal weight and have a low risk for diabetes.  More often and at a younger age if you are overweight or have a high risk for diabetes. What should I know about preventing infection? Hepatitis B If you have a higher risk for hepatitis B, you should be screened for this virus. Talk with your health care provider to find out if you are at risk for hepatitis B infection. Hepatitis C Blood testing is recommended for:  Everyone born from 1945 through 1965.  Anyone with known risk factors for hepatitis C. Sexually transmitted infections (STIs)  You should be screened each   year for STIs, including gonorrhea and chlamydia, if: ? You are sexually active and are younger than 55 years of age. ? You are older than 55 years of age and your health care provider tells you that you are at risk for this type of infection. ? Your sexual activity has changed since you were last screened, and you are at increased risk for chlamydia or gonorrhea. Ask your health care provider if you are at risk.  Ask your  health care provider about whether you are at high risk for HIV. Your health care provider may recommend a prescription medicine to help prevent HIV infection. If you choose to take medicine to prevent HIV, you should first get tested for HIV. You should then be tested every 3 months for as long as you are taking the medicine. Follow these instructions at home: Lifestyle  Do not use any products that contain nicotine or tobacco, such as cigarettes, e-cigarettes, and chewing tobacco. If you need help quitting, ask your health care provider.  Do not use street drugs.  Do not share needles.  Ask your health care provider for help if you need support or information about quitting drugs. Alcohol use  Do not drink alcohol if your health care provider tells you not to drink.  If you drink alcohol: ? Limit how much you have to 0-2 drinks a day. ? Be aware of how much alcohol is in your drink. In the [place] one drink equals one 12 oz bottle of beer (355 mL), one 5 oz glass of wine (148 mL), or one 1 oz glass of hard liquor (44 mL). General instructions  Schedule regular health, dental, and eye exams.  Stay current with your vaccines.  Tell your health care provider if: ? You often feel depressed. ? You have ever been abused or do not feel safe at home. Summary  Adopting a healthy lifestyle and getting preventive care are important in promoting health and wellness.  Follow your health care provider's instructions about healthy diet, exercising, and getting tested or screened for diseases.  Follow your health care provider's instructions on monitoring your cholesterol and blood pressure. This information is not intended to replace advice given to you by your health care provider. Make sure you discuss any questions you have with your health care provider. Document Revised: 09/02/2018 Document Reviewed: 09/02/2018 Elsevier Patient Education  2021 [employer]  Heart Murmur A heart murmur  is an extra sound that is caused by chaotic blood flow through the valves of the heart. The murmur can be heard as a "hum" or "whoosh" sound when blood flows through the heart. There are two types of heart murmurs:  Innocent (benign) murmurs. Most people with this type of heart murmur do not have a heart problem. Many children have innocent heart murmurs. Your health care provider may suggest some basic tests to find out whether your murmur is an innocent murmur. If an innocent heart murmur is found, there is no need for further tests or treatment and no need to restrict activities or stop playing sports.  Abnormal murmurs. These types of murmurs can occur in children and adults. Abnormal murmurs may be a sign of a more serious heart condition, such as a heart defect present at birth (congenital defect) or heart valve disease. What are the causes? The heart has four areas called chambers. Valves separate the upper and lower chambers from each other (tricuspid valve and mitral valve) and separate the lower chambers of the  heart from pathways that lead away from the heart (aortic valve and pulmonary valve). Normally, the valves open to let blood flow through or out of your heart, and then they shut to keep the blood from flowing backward. This condition is caused by heart valves that are not working properly.  In children, abnormal heart murmurs are typically caused by congenital defects.  In adults, abnormal murmurs are usually caused by heart valve problems from disease, infection, or aging. This condition may also be caused by:  Pregnancy.  Fever.  Overactive thyroid gland.  Anemia.  Exercise.  Rapid growth spurts (in children).   What are the signs or symptoms? Innocent murmurs do not cause symptoms, and many people with abnormal murmurs may not have symptoms. If symptoms do develop, they may include:  Shortness of breath.  Blue coloring of the skin, especially on the  fingertips.  Chest pain.  Palpitations, or feeling a fluttering or skipped heartbeat.  Fainting.  Persistent cough.  Getting tired much faster than expected.  Swelling in the abdomen, feet, or ankles. How is this diagnosed? This condition may be diagnosed during a routine physical or other exam. If your health care provider hears a murmur with a stethoscope, he or she will listen for:  Where the murmur is located in your heart.  How long the murmur lasts (duration).  When the murmur is heard during the heartbeat.  How loud the murmur is. This may help the health care provider figure out what is causing the murmur. You may be referred to a heart specialist (cardiologist). You may also have other tests, including:  Electrocardiogram (ECG or EKG). This test measures the electrical activity of your heart.  Echocardiogram. This test uses high frequency sound waves to make pictures of your heart.  MRI or chest X-ray.  Cardiac catheterization. This test looks at blood flow through the arteries around the heart. For children and adults who have an abnormal heart murmur and want to stay active, it is important to:  Complete testing.  Review test results.  Receive recommendations from your health care provider. If heart disease is present, it may not be safe to play or be active. How is this treated? Heart murmurs themselves do not need treatment. In some cases, a heart murmur may go away on its own. If an underlying problem or disease is causing the murmur, you may need treatment. If treatment is needed, it will depend on the type and severity of the disease or heart problem causing the murmur. Treatment may include:  Medicine.  Surgery.  Dietary and lifestyle changes. Follow these instructions at home:  Talk with your health care provider before participating in sports or other activities that require a lot of effort and energy (are strenuous).  Learn as much as possible  about your condition and any related diseases. Ask your health care provider if you may be at risk for any medical emergencies.  Talk with your health care provider about what symptoms you should look out for.  It is up to you to get your test results. Ask your health care provider, or the department that is doing the test, when your results will be ready.  Keep all follow-up visits as told by your health care provider. This is important. Contact a health care provider if:  You are frequently short of breath.  You feel more tired than usual.  You are having a hard time keeping up with normal activities or fitness routines.  You  have swelling in your ankles or feet.  You notice that your heart often beats irregularly.  You develop any new symptoms. Get help right away if:  You have chest pain.  You are having trouble breathing.  You feel light-headed or you pass out.  Your symptoms suddenly get worse. These symptoms may represent a serious problem that is an emergency. Do not wait to see if the symptoms will go away. Get medical help right away. Call your local emergency services (911 in the [place] Do not drive yourself to the hospital. Summary  Normally, the heart valves open to let blood flow through or out of your heart, and then they shut to keep the blood from flowing backward.  A heart murmur is caused by heart valves that are not working properly.  You may need treatment if an underlying problem or disease is causing the heart murmur. Treatment may include medicine, surgery, or dietary and lifestyle changes.  Talk with your health care provider before participating in sports or other activities that require a lot of effort and energy (are strenuous).  Talk with your health care provider about what symptoms you should watch out for. This information is not intended to replace advice given to you by your health care provider. Make sure you discuss any questions you have  with your health care provider. Document Revised: 03/03/2018 Document Reviewed: 03/03/2018 Elsevier Patient Education  2021 [employer] Inc.  Palpitations Palpitations are feelings that your heartbeat is irregular or is faster than normal. It may feel like your heart is fluttering or skipping a beat. Palpitations are usually not a serious problem. They may be caused by many things, including smoking, caffeine, alcohol, stress, and certain medicines or drugs. Most causes of palpitations are not serious. However, some palpitations can be a sign of a serious problem. You may need further tests to rule out serious medical problems. Follow these instructions at home: Pay attention to any changes in your condition. Take these actions to help manage your symptoms: Eating and drinking  Avoid foods and drinks that may cause palpitations. These may include: ? Caffeinated coffee, tea, soft drinks, diet pills, and energy drinks. ? Chocolate. ? Alcohol. Lifestyle  Take steps to reduce your stress and anxiety. Things that can help you relax include: ? Yoga. ? Mind-body activities, such as deep breathing, meditation, or using words and images to create positive thoughts (guided imagery). ? Physical activity, such as swimming, jogging, or walking. Tell your health care provider if your palpitations increase with activity. If you have chest pain or shortness of breath with activity, do not continue the activity until you are seen by your health care provider. ? Biofeedback. This is a method that helps you learn to use your mind to control things in your body, such as your heartbeat.  Do not use drugs, including cocaine or ecstasy. Do not use marijuana.  Get plenty of rest and sleep. Keep a regular bed time. General instructions  Take over-the-counter and prescription medicines only as told by your health care provider.  Do not use any products that contain nicotine or tobacco, such as cigarettes and  e-cigarettes. If you need help quitting, ask your health care provider.  Keep all follow-up visits as told by your health care provider. This is important. These may include visits for further testing if palpitations do not go away or get worse.      Contact a health care provider if you:  Continue to have a fast  or irregular heartbeat after 24 hours.  Notice that your palpitations occur more often. Get help right away if you:  Have chest pain or shortness of breath.  Have a severe headache.  Feel dizzy or you faint. Summary  Palpitations are feelings that your heartbeat is irregular or is faster than normal. It may feel like your heart is fluttering or skipping a beat.  Palpitations may be caused by many things, including smoking, caffeine, alcohol, stress, certain medicines, and drugs.  Although most causes of palpitations are not serious, some causes can be a sign of a serious medical problem.  Get help right away if you faint or have chest pain, shortness of breath, a severe headache, or dizziness. This information is not intended to replace advice given to you by your health care provider. Make sure you discuss any questions you have with your health care provider. Document Revised: 10/22/2017 Document Reviewed: 10/22/2017 Elsevier Patient Education  2021 [employer] Inc.  Preventive Care 2-37 Years Old, Male Preventive care refers to lifestyle choices and visits with your health care provider that can promote health and wellness. This includes:  A yearly physical exam. This is also called an annual wellness visit.  Regular dental and eye exams.  Immunizations.  Screening for certain conditions.  Healthy lifestyle choices, such as: ? Eating a healthy diet. ? Getting regular exercise. ? Not using drugs or products that contain nicotine and tobacco. ? Limiting alcohol use. What can I expect for my preventive care visit? Physical exam Your health care provider will  check your:  Height and weight. These may be used to calculate your BMI (body mass index). BMI is a measurement that tells if you are at a healthy weight.  Heart rate and blood pressure.  Body temperature.  Skin for abnormal spots. Counseling Your health care provider may ask you questions about your:  Past medical problems.  Family's medical history.  Alcohol, tobacco, and drug use.  Emotional well-being.  Home life and relationship well-being.  Sexual activity.  Diet, exercise, and sleep habits.  Work and work Astronomer.  Access to firearms. What immunizations do I need? Vaccines are usually given at various ages, according to a schedule. Your health care provider will recommend vaccines for you based on your age, medical history, and lifestyle or other factors, such as travel or where you work.   What tests do I need? Blood tests  Lipid and cholesterol levels. These may be checked every 5 years, or more often if you are over 2 years old.  Hepatitis C test.  Hepatitis B test. Screening  Lung cancer screening. You may have this screening every year starting at age 52 if you have a 30-pack-year history of smoking and currently smoke or have quit within the past 15 years.  Prostate cancer screening. Recommendations will vary depending on your family history and other risks.  Genital exam to check for testicular cancer or hernias.  Colorectal cancer screening. ? All adults should have this screening starting at age 46 and continuing until age 48. ? Your health care provider may recommend screening at age 40 if you are at increased risk. ? You will have tests every 1-10 years, depending on your results and the type of screening test.  Diabetes screening. ? This is done by checking your blood sugar (glucose) after you have not eaten for a while (fasting). ? You may have this done every 1-3 years.  STD (sexually transmitted disease) testing, if you are at  risk. Follow these instructions at home: Eating and drinking  Eat a diet that includes fresh fruits and vegetables, whole grains, lean protein, and low-fat dairy products.  Take vitamin and mineral supplements as recommended by your health care provider.  Do not drink alcohol if your health care provider tells you not to drink.  If you drink alcohol: ? Limit how much you have to 0-2 drinks a day. ? Be aware of how much alcohol is in your drink. In the [place] one drink equals one 12 oz bottle of beer (355 mL), one 5 oz glass of wine (148 mL), or one 1 oz glass of hard liquor (44 mL).   Lifestyle  Take daily care of your teeth and gums. Brush your teeth every morning and night with fluoride toothpaste. Floss one time each day.  Stay active. Exercise for at least 30 minutes 5 or more days each week.  Do not use any products that contain nicotine or tobacco, such as cigarettes, e-cigarettes, and chewing tobacco. If you need help quitting, ask your health care provider.  Do not use drugs.  If you are sexually active, practice safe sex. Use a condom or other form of protection to prevent STIs (sexually transmitted infections).  If told by your health care provider, take low-dose aspirin daily starting at age 18.  Find healthy ways to cope with stress, such as: ? Meditation, yoga, or listening to music. ? Journaling. ? Talking to a trusted person. ? Spending time with friends and family. Safety  Always wear your seat belt while driving or riding in a vehicle.  Do not drive: ? If you have been drinking alcohol. Do not ride with someone who has been drinking. ? When you are tired or distracted. ? While texting.  Wear a helmet and other protective equipment during sports activities.  If you have firearms in your house, make sure you follow all gun safety procedures. What's next?  Go to your health care provider once a year for an annual wellness visit.  Ask your health care  provider how often you should have your eyes and teeth checked.  Stay up to date on all vaccines. This information is not intended to replace advice given to you by your health care provider. Make sure you discuss any questions you have with your health care provider. Document Revised: 06/08/2019 Document Reviewed: 09/03/2018 Elsevier Patient Education  2021 [employer].

## 2020-12-11 ENCOUNTER — Other Ambulatory Visit: Payer: Self-pay | Admitting: Family Medicine

## 2020-12-11 ENCOUNTER — Other Ambulatory Visit: Payer: Self-pay

## 2020-12-11 ENCOUNTER — Telehealth: Payer: Self-pay | Admitting: Family Medicine

## 2020-12-11 DIAGNOSIS — R002 Palpitations: Secondary | ICD-10-CM

## 2020-12-11 DIAGNOSIS — I10 Essential (primary) hypertension: Secondary | ICD-10-CM

## 2020-12-11 MED ORDER — SIMVASTATIN 40 MG PO TABS: 40.0000 mg | ORAL_TABLET | Freq: Every day | ORAL | 1 refills | Status: DC

## 2020-12-11 MED ORDER — LISINOPRIL 20 MG PO TABS: 20.0000 mg | ORAL_TABLET | Freq: Every day | ORAL | 1 refills | Status: DC

## 2020-12-11 MED ORDER — PROPRANOLOL HCL ER BEADS 80 MG PO CP24: ORAL_CAPSULE | ORAL | 1 refills | Status: DC

## 2020-12-11 NOTE — Telephone Encounter (Signed)
Pt called and requested to speak to [facility] about lab results, please advise

## 2020-12-11 NOTE — Telephone Encounter (Signed)
Returned patients call went over labs and refill on medication.

## 2020-12-25 DIAGNOSIS — E785 Hyperlipidemia, unspecified: Secondary | ICD-10-CM | POA: Insufficient documentation

## 2020-12-25 DIAGNOSIS — E782 Mixed hyperlipidemia: Secondary | ICD-10-CM | POA: Insufficient documentation

## 2020-12-27 ENCOUNTER — Encounter: Payer: Self-pay | Admitting: Cardiology

## 2020-12-27 ENCOUNTER — Other Ambulatory Visit: Payer: Self-pay

## 2020-12-27 ENCOUNTER — Ambulatory Visit (INDEPENDENT_AMBULATORY_CARE_PROVIDER_SITE_OTHER): Payer: 59 | Admitting: Cardiology

## 2020-12-27 VITALS — BP 134/76 | HR 59 | Ht 71.0 in | Wt 187.0 lb

## 2020-12-27 DIAGNOSIS — I10 Essential (primary) hypertension: Secondary | ICD-10-CM | POA: Diagnosis not present

## 2020-12-27 DIAGNOSIS — R002 Palpitations: Secondary | ICD-10-CM

## 2020-12-27 DIAGNOSIS — E7800 Pure hypercholesterolemia, unspecified: Secondary | ICD-10-CM | POA: Diagnosis not present

## 2020-12-27 DIAGNOSIS — R011 Cardiac murmur, unspecified: Secondary | ICD-10-CM | POA: Insufficient documentation

## 2020-12-27 HISTORY — DX: Cardiac murmur, unspecified: R01.1

## 2020-12-27 MED ORDER — ROSUVASTATIN CALCIUM 20 MG PO TABS: 20.0000 mg | ORAL_TABLET | Freq: Every day | ORAL | 1 refills | Status: DC

## 2020-12-27 NOTE — Patient Instructions (Signed)
Medication Instructions:  Your physician has recommended you make the following change in your medication:  STOP: Simvastatin   START: Crestor 20 mg daily   *If you need a refill on your cardiac medications before your next appointment, please call your pharmacy*   Lab Work: Your physician recommends that you return for lab work in 6 weeks fasting: lipid, lft   If you have labs (blood work) drawn today and your tests are completely normal, you will receive your results only by: Marland Kitchen. MyChart Message (if you have MyChart) OR . A paper copy in the mail If you have any lab test that is abnormal or we need to change your treatment, we will call you to review the results.   Testing/Procedures: Your physician has requested that you have an echocardiogram. Echocardiography is a painless test that uses sound waves to create images of your heart. It provides your doctor with information about the size and shape of your heart and how well your heart's chambers and valves are working. This procedure takes approximately one hour. There are no restrictions for this procedure.     Follow-Up: At [facility], you and your health needs are our priority.  As part of our continuing mission to provide you with exceptional heart care, we have created designated Provider Care Teams.  These Care Teams include your primary Cardiologist (physician) and Advanced Practice Providers (APPs -  Physician Assistants and Nurse Practitioners) who all work together to provide you with the care you need, when you need it.  We recommend signing up for the patient portal called "MyChart".  Sign up information is provided on this After Visit Summary.  MyChart is used to connect with patients for Virtual Visits (Telemedicine).  Patients are able to view lab/test results, encounter notes, upcoming appointments, etc.  Non-urgent messages can be sent to your provider as well.   To learn more about what you can do with MyChart, go  to [email or web address].    Your next appointment:   2 month(s)  The format for your next appointment:   In Person  Provider:   Gypsy Balsamobert Krasowski, MD   Other Instructions   Echocardiogram An echocardiogram is a test that uses sound waves (ultrasound) to produce images of the heart. Images from an echocardiogram can provide important information about:  Heart size and shape.  The size and thickness and movement of your heart's walls.  Heart muscle function and strength.  Heart valve function or if you have stenosis. Stenosis is when the heart valves are too narrow.  If blood is flowing backward through the heart valves (regurgitation).  A tumor or infectious growth around the heart valves.  Areas of heart muscle that are not working well because of poor blood flow or injury from a heart attack.  Aneurysm detection. An aneurysm is a weak or damaged part of an artery wall. The wall bulges out from the normal force of blood pumping through the body. Tell a health care provider about:  Any allergies you have.  All medicines you are taking, including vitamins, herbs, eye drops, creams, and over-the-counter medicines.  Any blood disorders you have.  Any surgeries you have had.  Any medical conditions you have.  Whether you are pregnant or may be pregnant. What are the risks? Generally, this is a safe test. However, problems may occur, including an allergic reaction to dye (contrast) that may be used during the test. What happens before the test? No specific preparation is needed. You  may eat and drink normally. What happens during the test?  You will take off your clothes from the waist up and put on a hospital gown.  Electrodes or electrocardiogram (ECG)patches may be placed on your chest. The electrodes or patches are then connected to a device that monitors your heart rate and rhythm.  You will lie down on a table for an ultrasound exam. A gel will be applied to  your chest to help sound waves pass through your skin.  A handheld device, called a transducer, will be pressed against your chest and moved over your heart. The transducer produces sound waves that travel to your heart and bounce back (or "echo" back) to the transducer. These sound waves will be captured in real-time and changed into images of your heart that can be viewed on a video monitor. The images will be recorded on a computer and reviewed by your health care provider.  You may be asked to change positions or hold your breath for a short time. This makes it easier to get different views or better views of your heart.  In some cases, you may receive contrast through an IV in one of your veins. This can improve the quality of the pictures from your heart. The procedure may vary among health care providers and hospitals.   What can I expect after the test? You may return to your normal, everyday life, including diet, activities, and medicines, unless your health care provider tells you not to do that. Follow these instructions at home:  It is up to you to get the results of your test. Ask your health care provider, or the department that is doing the test, when your results will be ready.  Keep all follow-up visits. This is important. Summary  An echocardiogram is a test that uses sound waves (ultrasound) to produce images of the heart.  Images from an echocardiogram can provide important information about the size and shape of your heart, heart muscle function, heart valve function, and other possible heart problems.  You do not need to do anything to prepare before this test. You may eat and drink normally.  After the echocardiogram is completed, you may return to your normal, everyday life, unless your health care provider tells you not to do that. This information is not intended to replace advice given to you by your health care provider. Make sure you discuss any questions you have  with your health care provider. Document Revised: 05/02/2020 Document Reviewed: 05/02/2020 Elsevier Patient Education  2021 Elsevier Inc.  Simvastatin Tablets What is this medicine? SIMVASTATIN (SIM va stat in) is a statin. It lowers bad cholesterol and triglyceride levels in the blood. It also increases good cholesterol levels. It is used with lifestyle changes, like diet and exercise. It may be used alone or with other drugs. This medicine may be used for other purposes; ask your health care provider or pharmacist if you have questions. COMMON BRAND NAME(S): Zocor What should I tell my health care provider before I take this medicine? They need to know if you have any of these conditions:  diabetes (high blood sugar)  if you often drink alcohol  kidney disease  liver disease  muscle cramps, pain  stroke  thyroid disease  an unusual or allergic reaction to simvastatin, other medicines, foods, dyes, or preservatives  pregnant or trying to get pregnant  breast-feeding How should I use this medicine? Take this medicine by mouth. Take it as directed on the  prescription label at the same time every day. You can take it with or without food. If it upsets your stomach, take it with food. Keep taking it unless your health care provider tells you to stop. Do not take this medicine with grapefruit juice. Talk to your health care provider about the use of this medicine in children. While it may be prescribed for children as young as 10 for selected conditions, precautions do apply. Overdosage: If you think you have taken too much of this medicine contact a poison control center or emergency room at once. NOTE: This medicine is only for you. Do not share this medicine with others. What if I miss a dose? If you miss a dose, take it as soon as you can. If it is almost time for your next dose, take only that dose. Do not take double or extra doses. What may interact with this medicine? Do  not take this medicine with any of the following medications:  antiviral medicines for HIV or AIDS  certain antibiotics like clarithromycin, erythromycin, telithromycin  certain medicines for fungal infections like ketoconazole, itraconazole, posaconazole, voriconazole  conivaptan  cyclosporine  danazol  gemfibrozil  idelalisib  mifepristone, RU-486  nefazodone  supplements like red yeast rice This medicine may also interact with the following medications:  alcohol  certain medicines for blood pressure or heart disease like amlodipine, diltiazem, verapamil  certain medicines for irregular heart beat like amiodarone and dronedarone  certain medicines that treat or prevent blood clots like warfarin  colchicine  digoxin  grapefruit juice  lomitapide  niacin  ranolazine This list may not describe all possible interactions. Give your health care provider a list of all the medicines, herbs, non-prescription drugs, or dietary supplements you use. Also tell them if you smoke, drink alcohol, or use illegal drugs. Some items may interact with your medicine. What should I watch for while using this medicine? Visit your health care provider for regular checks on your progress. Tell your health care provider if your symptoms do not start to get better or if they get worse. Your health care provider may tell you to stop taking this medicine if you develop muscle problems. If your muscle problems do not go away after stopping this medicine, contact your health care provider. Do not become pregnant while taking this medicine. Women should inform their health care provider if they wish to become pregnant or think they might be pregnant. There is potential for serious harm to an unborn child. Talk to your health care provider for more information. Do not breast-feed an infant while taking this medicine. Birth control may not work properly while you are taking this medicine. Talk to  your health care provider about using an extra method of birth control. This medicine may increase blood sugar. Ask your health care provider if changes in diet or medicines are needed if you have diabetes. If you are going to need surgery or other procedure, tell your health care provider that you are using this medicine. Taking this medicine is only part of a total heart healthy program. Your health care provider may give you a special diet to follow. Avoid alcohol. Avoid smoking. Ask your health care provider how much you should exercise. What side effects may I notice from receiving this medicine? Side effects that you should report to your doctor or health care provider as soon as possible:  allergic reactions (skin rash, itching or hives; swelling of the face, lips, or tongue)  confusion  depressed mood  high blood sugar (increased hunger, thirst, or urination; unusually weak or tired, blurry vision)  infection (fever, chills, cough, sore throat, pain, or trouble passing urine)  joint pain  liver injury (dark yellow or brown urine; general ill feeling or flu-like symptoms; loss of appetite, right upper belly pain; unusually weak or tired, yellowing of the eyes or skin)  loss of memory  muscle injury (dark urine; trouble passing urine or change in the amount of urine; unusually weak or tired; muscle pain; back pain)  redness, blistering, peeling, or loosening of the skin, including inside the mouth Side effects that usually do not require medical attention (report to your doctor or health care provider if they continue or are bothersome):  constipation  diarrhea  dizziness  headache  nausea  passing gas  stomach pain  trouble sleeping  upset stomach This list may not describe all possible side effects. Call your doctor for medical advice about side effects. You may report side effects to FDA at 1-800-FDA-1088. Where should I keep my medicine? Keep out of the reach  of children and pets. Store at room temperature between 20 and 25 degrees C (68 and 77 degrees F). Get rid of any unused medicine after the expiration date. To get rid of medicines that are no longer needed or have expired:  Take the medicine to a medicine take-back program. Check with your pharmacy or law enforcement to find a location.  If you cannot return the medicine, check the label or package insert to see if the medicine should be thrown out in the garbage or flushed down the toilet. If you are not sure, ask your health care provider. If it is safe to put it in the trash, take the medicine out of the container. Mix the medicine with cat litter, dirt, coffee grounds, or other unwanted substance. Seal the mixture in a bag or container. Put it in the trash. NOTE: This sheet is a summary. It may not cover all possible information. If you have questions about this medicine, talk to your doctor, pharmacist, or health care provider.  2021 Elsevier/Gold Standard (2020-07-22 17:35:36)

## 2020-12-27 NOTE — Addendum Note (Signed)
Addended by: Hazle Quant on: 12/27/2020 10:31 AM   Modules accepted: Orders

## 2020-12-27 NOTE — Progress Notes (Signed)
Cardiology Consultation:    Date:  12/27/2020   ID:  Phillip Gay, DOB [birth year], MRN [id number]  PCP:  Phillip Sax, MD  Cardiologist:  Phillip Balsam, MD   Referring MD: Phillip Gay,*   Chief Complaint  Patient presents with  . Heart Mumur    History of Present Illness:    Phillip Gay is a 55 y.o. male who is being seen today for the evaluation of heart murmur at the request of Phillip Gay, Phillip Gay,*.  He recently relocated from the [place] area to our area.  He was referred to [place] because on the routine examination he was discovered to have heart murmur.  Overall he is completely asymptomatic meaning he does not have shortness of breath chest pain tightness squeezing pressure burning chest there is no swelling of lower extremity there is no proximal nocturnal dyspnea.  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  He has been complaining of having palpitation for long time.  He is being on long-acting propranolol for many years and that seems to be helping.  He tell me if by mistake he skipped some of his medication he will feel that his heart speeding up somewhat.  He does not snore does not have family history of premature coronary artery disease, he smoked but stopped many years ago.  Does exercise on the regular basis without any difficulties.  He is not on any special diet.  Past Medical History:  Diagnosis Date  . Essential hypertension 12/08/2020  . Healthcare maintenance 12/08/2020  . Hemorrhoids 09/23/2008  . High cholesterol 09/23/1998  . History of weight loss 12/08/2020  . Hyperlipidemia   . Hypertension   . Irregular heartbeat 09/23/2008  . Palpitations 12/08/2020    Past Surgical History:  Procedure Laterality Date  . ANKLE FRACTURE SURGERY Left   . HERNIA REPAIR      Current  Medications: Current Meds  Medication Sig  . lisinopril (ZESTRIL) 20 MG tablet Take 1 tablet (20 mg total) by mouth daily.  . Multiple Vitamins-Minerals (MULTIVITAL PO) Take 1 tablet by mouth daily. Unknown strength  . propranolol ER (INDERAL LA) 80 MG 24 hr capsule Take one daily (Patient taking differently: Take 80 mg by mouth daily. Take one daily)  . simvastatin (ZOCOR) 40 MG tablet Take 1 tablet (40 mg total) by mouth daily at 6 PM.     Allergies:   Patient has no known allergies.   Social History   Socioeconomic History  . Marital status: Married    Spouse name: Not on file  . Number of children: Not on file  . Years of education: Not on file  . Highest education level: Not on file  Occupational History  . Not on file  Tobacco Use  . Smoking status: Former Games developer  . Smokeless tobacco: Never Used  Vaping Use  . Vaping Use: Never used  Substance and Sexual Activity  . Alcohol use: Yes    Alcohol/week: 2.0 standard drinks    Types: 1 Cans of beer, 1 Standard drinks or equivalent per week  . Drug use: Never  . Sexual activity: Yes  Other Topics Concern  . Not on file  Social History Narrative  . Not on file   Social Determinants of Health   Financial Resource Strain: Not on file  Food Insecurity: Not on  file  Transportation Needs: Not on file  Physical Activity: Not on file  Stress: Not on file  Social Connections: Not on file     Family History: The patient's family history includes Cancer in his father; Hypertension in his father. ROS:   Please see the history of present illness.    All 14 point review of systems negative except as described per history of present illness.  EKGs/Labs/Other Studies Reviewed:    The following studies were reviewed today:   EKG:  EKG is  ordered today.  The ekg ordered today demonstrates first-degree AV block, normal sinus rhythm, left axis deviation, cannot rule out septal MI.  Recent Labs: 12/08/2020: ALT 24; BUN 14;  Creatinine, Ser 0.85; Hemoglobin 16.3; Platelets 266.0; Potassium 4.9; Sodium 140; TSH 1.23  Recent Lipid Panel    Component Value Date/Time   CHOL 199 12/08/2020 1006   TRIG 193.0 (H) 12/08/2020 1006   HDL 38.60 (L) 12/08/2020 1006   CHOLHDL 5 12/08/2020 1006   VLDL 38.6 12/08/2020 1006   LDLCALC 122 (H) 12/08/2020 1006    Physical Exam:    VS:  BP 134/76 (BP Location: Right Arm, Patient Position: Sitting)   Pulse (!) 59   Ht 5\' 11"  (1.803 m)   Wt 187 lb (84.8 kg)   SpO2 94%   BMI 26.08 kg/m     Wt Readings from Last 3 Encounters:  12/27/20 187 lb (84.8 kg)  12/08/20 182 lb 12.8 oz (82.9 kg)  03/20/20 200 lb (90.7 kg)     GEN:  Well nourished, well developed in no acute distress HEENT: Normal NECK: No JVD; No carotid bruits LYMPHATICS: No lymphadenopathy CARDIAC: RRR, soft systolic murmur heard at apex 1/6 without radiation also there is very soft systolic murmur in the right upper portion of the sternum barely a scalpel., no rubs, no gallops RESPIRATORY:  Clear to auscultation without rales, wheezing or rhonchi  ABDOMEN: Soft, non-tender, non-distended MUSCULOSKELETAL:  No edema; No deformity  SKIN: Warm and dry NEUROLOGIC:  Alert and oriented x 3 PSYCHIATRIC:  Normal affect   ASSESSMENT:    1. Essential hypertension   2. Heart murmur   3. Palpitations   4. High cholesterol    PLAN:    In order of problems listed above:  1. Essential hypertension, blood pressure seems to well controlled continue present management. 2. Heart murmur very soft I be rather surprised this is critical finding but echocardiogram need to be done to clarify that and will do this. 3. Palpitations seems to be successfully suppressed with the long-acting propranolol which I will continue for now.  In the future he may require monitor to investigate that it will depends on results of the echocardiogram. 4. Dyslipidemia I look at his K PN which show me his LDL of 122 HDL 38, he is on  simvastatin 40.  I did calculate his 10 years risk for having an coronary events which came as 8.2% which is intermediate, with proper management that risk can be reduced to 3.2.  I recommended to switch from simvastatin to more effective statin I propose to start Crestor 20 which she agreed to do we will check his fasting lipid profile AST LT within next 6 weeks. 5. We did talk about healthy lifestyle need to exercise on the regular basis which she already does as well as the need to stick with good diet and I discussed the basic of Mediterranean diet.   Medication Adjustments/Labs and Tests Ordered: Current medicines  are reviewed at length with the patient today.  Concerns regarding medicines are outlined above.  No orders of the defined types were placed in this encounter.  No orders of the defined types were placed in this encounter.   Signed, Phillip Lea, MD, [facility]. 12/27/2020 10:24 AM    [facility]

## 2021-01-31 ENCOUNTER — Ambulatory Visit (HOSPITAL_BASED_OUTPATIENT_CLINIC_OR_DEPARTMENT_OTHER)
Admission: RE | Admit: 2021-01-31 | Discharge: 2021-01-31 | Disposition: A | Discharge status: Home / Self Care | Payer: 59 | Source: Ambulatory Visit | Attending: Cardiology | Admitting: Cardiology

## 2021-01-31 ENCOUNTER — Other Ambulatory Visit: Payer: Self-pay

## 2021-01-31 DIAGNOSIS — I10 Essential (primary) hypertension: Secondary | ICD-10-CM | POA: Diagnosis present

## 2021-01-31 DIAGNOSIS — R002 Palpitations: Secondary | ICD-10-CM | POA: Diagnosis present

## 2021-01-31 DIAGNOSIS — E7800 Pure hypercholesterolemia, unspecified: Secondary | ICD-10-CM | POA: Diagnosis present

## 2021-01-31 DIAGNOSIS — R011 Cardiac murmur, unspecified: Secondary | ICD-10-CM | POA: Diagnosis present

## 2021-01-31 LAB — ECHOCARDIOGRAM COMPLETE
AR max vel: 2.45 cm2
AV Area VTI: 2.57 cm2
AV Area mean vel: 2.53 cm2
AV Mean grad: 4 mmHg
AV Peak grad: 8 mmHg
Ao pk vel: 1.41 m/s
Area-P 1/2: 3.23 cm2
Calc EF: 66.4 %
P 1/2 time: 428 msec
S' Lateral: 2.89 cm
Single Plane A2C EF: 65.8 %
Single Plane A4C EF: 65.5 %

## 2021-02-10 LAB — HEPATIC FUNCTION PANEL
ALT: 26 IU/L (ref 0–44)
AST: 27 IU/L (ref 0–40)
Albumin: 5 g/dL — ABNORMAL HIGH (ref 3.8–4.9)
Alkaline Phosphatase: 57 IU/L (ref 44–121)
Bilirubin Total: 0.4 mg/dL (ref 0.0–1.2)
Bilirubin, Direct: 0.14 mg/dL (ref 0.00–0.40)
Total Protein: 7.3 g/dL (ref 6.0–8.5)

## 2021-02-10 LAB — LIPID PANEL
Chol/HDL Ratio: 4.3 ratio (ref 0.0–5.0)
Cholesterol, Total: 172 mg/dL (ref 100–199)
HDL: 40 mg/dL (ref >39)
LDL Chol Calc (NIH): 94 mg/dL (ref 0–99)
Triglycerides: 225 mg/dL — ABNORMAL HIGH (ref 0–149)
VLDL Cholesterol Cal: 38 mg/dL (ref 5–40)

## 2021-02-28 ENCOUNTER — Ambulatory Visit (INDEPENDENT_AMBULATORY_CARE_PROVIDER_SITE_OTHER): Payer: 59 | Admitting: Cardiology

## 2021-02-28 ENCOUNTER — Other Ambulatory Visit: Payer: Self-pay

## 2021-02-28 ENCOUNTER — Encounter: Payer: Self-pay | Admitting: Cardiology

## 2021-02-28 VITALS — BP 128/86 | HR 58 | Ht 71.0 in | Wt 191.0 lb

## 2021-02-28 DIAGNOSIS — I712 Thoracic aortic aneurysm, without rupture, unspecified: Secondary | ICD-10-CM | POA: Insufficient documentation

## 2021-02-28 DIAGNOSIS — I10 Essential (primary) hypertension: Secondary | ICD-10-CM

## 2021-02-28 DIAGNOSIS — E7800 Pure hypercholesterolemia, unspecified: Secondary | ICD-10-CM

## 2021-02-28 DIAGNOSIS — R002 Palpitations: Secondary | ICD-10-CM

## 2021-02-28 NOTE — Patient Instructions (Signed)
Medication Instructions:  Your physician recommends that you continue on your current medications as directed. Please refer to the Current Medication list given to you today.  *If you need a refill on your cardiac medications before your next appointment, please call your pharmacy*   Lab Work: None If you have labs (blood work) drawn today and your tests are completely normal, you will receive your results only by: MyChart Message (if you have MyChart) OR A paper copy in the mail If you have any lab test that is abnormal or we need to change your treatment, we will call you to review the results.   Testing/Procedures: Non-Cardiac CT scanning, (CAT scanning), is a noninvasive, special x-ray that produces cross-sectional images of the body using x-rays and a computer. CT scans help physicians diagnose and treat medical conditions. For some CT exams, a contrast material is used to enhance visibility in the area of the body being studied. CT scans provide greater clarity and reveal more details than regular x-ray exams.  Your physician has requested that you have an abdominal aorta duplex. During this test, an ultrasound is used to evaluate the aorta. Allow 30 minutes for this exam. Do not eat after midnight the day before and avoid carbonated beverages    Follow-Up: At [facility], you and your health needs are our priority.  As part of our continuing mission to provide you with exceptional heart care, we have created designated Provider Care Teams.  These Care Teams include your primary Cardiologist (physician) and Advanced Practice Providers (APPs -  Physician Assistants and Nurse Practitioners) who all work together to provide you with the care you need, when you need it.  We recommend signing up for the patient portal called "MyChart".  Sign up information is provided on this After Visit Summary.  MyChart is used to connect with patients for Virtual Visits (Telemedicine).  Patients are  able to view lab/test results, encounter notes, upcoming appointments, etc.  Non-urgent messages can be sent to your provider as well.   To learn more about what you can do with MyChart, go to [email or web address].    Your next appointment:   6 month(s)  The format for your next appointment:   In Person  Provider:   Robert Krasowski, MD   Other Instructions   

## 2021-02-28 NOTE — Progress Notes (Signed)
Cardiology Office Note:    Date:  02/28/2021   ID:  Phillip Gay, DOB 02-09-1966, MRN [id number]  PCP:  [name], MD  Cardiologist:  Gypsy Balsam, MD    Referring MD: [name],*   Chief Complaint  Patient presents with  . Follow-up    History of Present Illness:    Phillip Gay is a 55 y.o. male who was referred to [place] because some heart murmur being heard for assessment of this problem.  He did have echocardiogram which showed only trivial mitral regurgitation, supplies and he did have some enlargement of the ascending aorta measuring 38 mm.  He comes today 2 months of follow-up.  Overall he is doing well.  Denies have any chest pain tightness squeezing pressure burning chest trying to be more active but admits that he is not too good at that.  Past Medical History:  Diagnosis Date  . Essential hypertension 12/08/2020  . Healthcare maintenance 12/08/2020  . Heart murmur 12/27/2020  . Hemorrhoids 09/23/2008  . High cholesterol 09/23/1998  . History of weight loss 12/08/2020  . Hyperlipidemia   . Hypertension   . Irregular heartbeat 09/23/2008  . Palpitations 12/08/2020    Past Surgical History:  Procedure Laterality Date  . ANKLE FRACTURE SURGERY Left   . HERNIA REPAIR      Current Medications: Current Meds  Medication Sig  . lisinopril (ZESTRIL) 20 MG tablet Take 1 tablet (20 mg total) by mouth daily.  . Multiple Vitamins-Minerals (MULTIVITAL PO) Take 1 tablet by mouth daily. Unknown strength  . propranolol ER (INDERAL LA) 80 MG 24 hr capsule Take one daily (Patient taking differently: Take 80 mg by mouth daily. Take one daily)  . rosuvastatin (CRESTOR) 20 MG tablet Take 1 tablet (20 mg total) by mouth daily.     Allergies:   Patient has no known allergies.   Social History   Socioeconomic History  . Marital status: Married    Spouse name: Not on file  . Number of children: Not on file  . Years of education: Not on file  .  Highest education level: Not on file  Occupational History  . Not on file  Tobacco Use  . Smoking status: Former Games developer  . Smokeless tobacco: Never Used  Vaping Use  . Vaping Use: Never used  Substance and Sexual Activity  . Alcohol use: Yes    Alcohol/week: 2.0 standard drinks    Types: 1 Cans of beer, 1 Standard drinks or equivalent per week  . Drug use: Never  . Sexual activity: Yes  Other Topics Concern  . Not on file  Social History Narrative  . Not on file   Social Determinants of Health   Financial Resource Strain: Not on file  Food Insecurity: Not on file  Transportation Needs: Not on file  Physical Activity: Not on file  Stress: Not on file  Social Connections: Not on file     Family History: The patient's family history includes Cancer in his father; Hypertension in his father. ROS:   Please see the history of present illness.    All 14 point review of systems negative except as described per history of present illness  EKGs/Labs/Other Studies Reviewed:      Recent Labs: 12/08/2020: BUN 14; Creatinine, Ser 0.85; Hemoglobin 16.3; Platelets 266.0; Potassium 4.9; Sodium 140; TSH 1.23 02/09/2021: ALT 26  Recent Lipid Panel    Component Value Date/Time   CHOL 172 02/09/2021 0917   TRIG  225 (H) 02/09/2021 0917   HDL 40 02/09/2021 0917   CHOLHDL 4.3 02/09/2021 0917   CHOLHDL 5 12/08/2020 1006   VLDL 38.6 12/08/2020 1006   LDLCALC 94 02/09/2021 0917    Physical Exam:    VS:  BP 128/86 (BP Location: Right Arm, Patient Position: Sitting)   Pulse (!) 58   Ht 5\' 11"  (1.803 m)   Wt 191 lb (86.6 kg)   SpO2 98%   BMI 26.64 kg/m     Wt Readings from Last 3 Encounters:  02/28/21 191 lb (86.6 kg)  12/27/20 187 lb (84.8 kg)  12/08/20 182 lb 12.8 oz (82.9 kg)     GEN:  Well nourished, well developed in no acute distress HEENT: Normal NECK: No JVD; No carotid bruits LYMPHATICS: No lymphadenopathy CARDIAC: RRR, no murmurs, no rubs, no gallops RESPIRATORY:   Clear to auscultation without rales, wheezing or rhonchi  ABDOMEN: Soft, non-tender, non-distended MUSCULOSKELETAL:  No edema; No deformity  SKIN: Warm and dry LOWER EXTREMITIES: no swelling NEUROLOGIC:  Alert and oriented x 3 PSYCHIATRIC:  Normal affect   ASSESSMENT:    1. Essential hypertension   2. Palpitations   3. High cholesterol   4. Thoracic aortic aneurysm without rupture (HCC)    PLAN:    In order of problems listed above:  1. Essential hypertension blood presumed well controlled continue present management. 2. Palpitations that been successfully suppressed with beta-blocker which I will continue. 3. Dyslipidemia he is on appropriate medications which I will continue. 4. Ascending thoracic aneurysm measuring only 38 mm on echocardiogram.  This is a new finding.  We will do CT of his chest with no contrast to look at thoracic aorta and will also do abdominal ultrasound to look for significant enlargement in his abdominal artery.  We did talk about good control of blood pressure that is needed.  And future recommendation will be made based on results of test.   Medication Adjustments/Labs and Tests Ordered: Current medicines are reviewed at length with the patient today.  Concerns regarding medicines are outlined above.  No orders of the defined types were placed in this encounter.  Medication changes: No orders of the defined types were placed in this encounter.   Signed, 12/10/20, MD, [facility] 02/28/2021 10:51 AM    [facility]

## 2021-02-28 NOTE — Addendum Note (Signed)
Addended by: Hazle Quant on: 02/28/2021 10:59 AM   Modules accepted: Orders

## 2021-03-06 ENCOUNTER — Ambulatory Visit (HOSPITAL_BASED_OUTPATIENT_CLINIC_OR_DEPARTMENT_OTHER)
Admission: RE | Admit: 2021-03-06 | Discharge: 2021-03-06 | Disposition: A | Discharge status: Home / Self Care | Payer: 59 | Source: Ambulatory Visit | Attending: Cardiology | Admitting: Cardiology

## 2021-03-06 ENCOUNTER — Telehealth: Payer: Self-pay | Admitting: Cardiology

## 2021-03-06 ENCOUNTER — Other Ambulatory Visit: Payer: Self-pay

## 2021-03-06 DIAGNOSIS — I712 Thoracic aortic aneurysm, without rupture, unspecified: Secondary | ICD-10-CM

## 2021-03-06 NOTE — Telephone Encounter (Signed)
Follow up:      Phillip Gay calling to check on the status of this order patient is in the office right now.

## 2021-03-06 NOTE — Telephone Encounter (Signed)
Spoke to [place]. The issue has been addressed.

## 2021-03-06 NOTE — Telephone Encounter (Signed)
Renee with [facility] states their tech would like to clarify patient CT order instructions.  The current order is for CT ANGIO CHEST AORTA W/CM & WO/CM. She states all angios are done with contrast. She states the patient also needs lab work prior to the CT, which the patient has not had. CT order will need to be corrected and patient will be rescheduled for a later date.  A call may be returned to [facility] to discuss at [phone].

## 2021-03-27 ENCOUNTER — Telehealth: Payer: Self-pay

## 2021-03-28 ENCOUNTER — Ambulatory Visit (HOSPITAL_COMMUNITY)
Admission: RE | Admit: 2021-03-28 | Discharge: 2021-03-28 | Disposition: A | Discharge status: Home / Self Care | Payer: 59 | Source: Ambulatory Visit | Attending: Internal Medicine | Admitting: Internal Medicine

## 2021-03-28 ENCOUNTER — Other Ambulatory Visit: Payer: Self-pay

## 2021-03-28 ENCOUNTER — Telehealth: Payer: Self-pay

## 2021-03-28 DIAGNOSIS — I712 Thoracic aortic aneurysm, without rupture, unspecified: Secondary | ICD-10-CM

## 2021-03-28 NOTE — Telephone Encounter (Signed)
Spoke with patient regarding results and recommendation.  Patient verbalizes understanding and is agreeable to plan of care. Advised patient to call back with any issues or concerns.  

## 2021-03-28 NOTE — Telephone Encounter (Signed)
-----   Message from Georgeanna Lea, MD sent at 03/28/2021 12:43 PM EDT ----- No significant enlargement of the abdominal aorta

## 2021-03-30 ENCOUNTER — Telehealth: Payer: Self-pay

## 2021-03-30 NOTE — Telephone Encounter (Signed)
-----   Message from Georgeanna Lea, MD sent at 03/30/2021 12:33 PM EDT ----- No significant enlargement of the aortic

## 2021-03-30 NOTE — Telephone Encounter (Signed)
Spoke with patient regarding results and recommendation.  Patient verbalizes understanding and is agreeable to plan of care. Advised patient to call back with any issues or concerns.  

## 2021-06-01 ENCOUNTER — Encounter: Payer: Self-pay | Admitting: Family Medicine

## 2021-06-13 ENCOUNTER — Encounter: Payer: Self-pay | Admitting: Family Medicine

## 2021-06-13 ENCOUNTER — Ambulatory Visit (INDEPENDENT_AMBULATORY_CARE_PROVIDER_SITE_OTHER): Payer: 59 | Admitting: Family Medicine

## 2021-06-13 ENCOUNTER — Other Ambulatory Visit: Payer: Self-pay

## 2021-06-13 VITALS — BP 128/76 | HR 56 | Temp 97.0°F | Ht 71.0 in | Wt 188.8 lb

## 2021-06-13 DIAGNOSIS — Z23 Encounter for immunization: Secondary | ICD-10-CM

## 2021-06-13 DIAGNOSIS — E782 Mixed hyperlipidemia: Secondary | ICD-10-CM

## 2021-06-13 DIAGNOSIS — R002 Palpitations: Secondary | ICD-10-CM

## 2021-06-13 DIAGNOSIS — I10 Essential (primary) hypertension: Secondary | ICD-10-CM | POA: Diagnosis not present

## 2021-06-13 LAB — COMPREHENSIVE METABOLIC PANEL
ALT: 58 U/L — ABNORMAL HIGH (ref 0–53)
AST: 38 U/L — ABNORMAL HIGH (ref 0–37)
Albumin: 5.2 g/dL (ref 3.5–5.2)
Alkaline Phosphatase: 39 U/L (ref 39–117)
BUN: 10 mg/dL (ref 6–23)
CO2: 28 mEq/L (ref 19–32)
Calcium: 10.3 mg/dL (ref 8.4–10.5)
Chloride: 103 mEq/L (ref 96–112)
Creatinine, Ser: 0.94 mg/dL (ref 0.40–1.50)
GFR: 91.75 mL/min (ref >60.00)
Glucose, Bld: 100 mg/dL — ABNORMAL HIGH (ref 70–99)
Potassium: 4.4 mEq/L (ref 3.5–5.1)
Sodium: 140 mEq/L (ref 135–145)
Total Bilirubin: 0.6 mg/dL (ref 0.2–1.2)
Total Protein: 7.9 g/dL (ref 6.0–8.3)

## 2021-06-13 LAB — URINALYSIS, ROUTINE W REFLEX MICROSCOPIC
Bilirubin Urine: NEGATIVE
Hgb urine dipstick: NEGATIVE
Ketones, ur: NEGATIVE
Leukocytes,Ua: NEGATIVE
Nitrite: NEGATIVE
RBC / HPF: NONE SEEN (ref 0–?)
Specific Gravity, Urine: 1.015 (ref 1.000–1.030)
Total Protein, Urine: NEGATIVE
Urine Glucose: NEGATIVE
Urobilinogen, UA: 0.2 — AB (ref 0.0–1.0)
WBC, UA: NONE SEEN (ref 0–?)
pH: 6 (ref 5.0–8.0)

## 2021-06-13 LAB — MICROALBUMIN / CREATININE URINE RATIO
Creatinine,U: 117.5 mg/dL
Microalb Creat Ratio: 0.9 mg/g (ref 0.0–30.0)
Microalb, Ur: 1.1 mg/dL (ref 0.0–1.9)

## 2021-06-13 LAB — CBC
HCT: 49.6 % (ref 39.0–52.0)
Hemoglobin: 16.6 g/dL (ref 13.0–17.0)
MCHC: 33.5 g/dL (ref 30.0–36.0)
MCV: 98.7 fl (ref 78.0–100.0)
Platelets: 225 K/uL (ref 150.0–400.0)
RBC: 5.02 Mil/uL (ref 4.22–5.81)
RDW: 13.3 % (ref 11.5–15.5)
WBC: 5.4 K/uL (ref 4.0–10.5)

## 2021-06-13 LAB — LIPID PANEL
Cholesterol: 175 mg/dL (ref 0–200)
HDL: 46.5 mg/dL
LDL Cholesterol: 89 mg/dL (ref 0–99)
NonHDL: 128.1
Total CHOL/HDL Ratio: 4
Triglycerides: 194 mg/dL — ABNORMAL HIGH (ref 0.0–149.0)
VLDL: 38.8 mg/dL (ref 0.0–40.0)

## 2021-06-13 MED ORDER — LISINOPRIL 20 MG PO TABS: 20.0000 mg | ORAL_TABLET | Freq: Every day | ORAL | 1 refills | Status: DC

## 2021-06-13 MED ORDER — PROPRANOLOL HCL ER 80 MG PO CP24: ORAL_CAPSULE | ORAL | 1 refills | Status: DC

## 2021-06-13 MED ORDER — ROSUVASTATIN CALCIUM 20 MG PO TABS: 20.0000 mg | ORAL_TABLET | Freq: Every day | ORAL | 1 refills | Status: DC

## 2021-06-13 NOTE — Progress Notes (Signed)
Established Patient Office Visit  Subjective:  Patient ID: Phillip Gay, male    DOB: [birth year]  Age: 55 y.o. MRN: [id number]  CC:  Chief Complaint  Patient presents with   Follow-up    6 month follow up, no concerns patient fasting.     HPI [name] presents for follow-up of hypertension and mixed hyperlipidemia.  Recently changed to rosuvastatin per cardiology.  He is tolerating the drug well.  He has become aware of the fat and cholesterol in his diet and is trying to moderate.  He does not smoke and is exercising.  Blood pressure is well controlled with lisinopril and Inderal LA.  Triglycerides have been elevated with recent lipid profiles.  Tells me that he has been fasting for them.  Including today.  He did have 2 cough drops today.  He has 4 children and they are all grown and flown.  1 lives in [place], another in [place] and 2 in [place] state.  Past Medical History:  Diagnosis Date   Essential hypertension 12/08/2020   Healthcare maintenance 12/08/2020   Heart murmur 12/27/2020   Hemorrhoids 09/23/2008   High cholesterol 09/23/1998   History of weight loss 12/08/2020   Hyperlipidemia    Hypertension    Irregular heartbeat 09/23/2008   Palpitations 12/08/2020    Past Surgical History:  Procedure Laterality Date   ANKLE FRACTURE SURGERY Left    HERNIA REPAIR      Family History  Problem Relation Age of Onset   Hypertension Father    Cancer Father     Social History   Socioeconomic History   Marital status: Married    Spouse name: Not on file   Number of children: Not on file   Years of education: Not on file   Highest education level: Not on file  Occupational History   Not on file  Tobacco Use   Smoking status: Former   Smokeless tobacco: Never  Vaping Use   Vaping Use: Never used  Substance and Sexual Activity   Alcohol use: Yes    Alcohol/week: 2.0 standard drinks    Types: 1 Cans of beer, 1 Standard drinks or  equivalent per week   Drug use: Never   Sexual activity: Yes  Other Topics Concern   Not on file  Social History Narrative   Not on file   Social Determinants of Health   Financial Resource Strain: Not on file  Food Insecurity: Not on file  Transportation Needs: Not on file  Physical Activity: Not on file  Stress: Not on file  Social Connections: Not on file  Intimate Partner Violence: Not on file    Outpatient Medications Prior to Visit  Medication Sig Dispense Refill   Multiple Vitamins-Minerals (MULTIVITAL PO) Take 1 tablet by mouth daily. Unknown strength     lisinopril (ZESTRIL) 20 MG tablet Take 1 tablet (20 mg total) by mouth daily. 90 tablet 1   propranolol ER (INDERAL LA) 80 MG 24 hr capsule Take one daily (Patient taking differently: Take 80 mg by mouth daily. Take one daily) 90 capsule 1   rosuvastatin (CRESTOR) 20 MG tablet Take 1 tablet (20 mg total) by mouth daily. 90 tablet 1   No facility-administered medications prior to visit.    No Known Allergies  ROS Review of Systems  Constitutional: Negative.   HENT: Negative.    Eyes:  Negative for photophobia and visual disturbance.  Respiratory: Negative.    Cardiovascular: Negative.  Gastrointestinal: Negative.   Genitourinary: Negative.   Neurological:  Negative for speech difficulty and weakness.  Psychiatric/Behavioral: Negative.       Objective:    Physical Exam Vitals and nursing note reviewed.  Constitutional:      General: He is not in acute distress.    Appearance: Normal appearance. He is normal weight. He is not ill-appearing, toxic-appearing or diaphoretic.  HENT:     Right Ear: Tympanic membrane, ear canal and external ear normal.     Left Ear: Tympanic membrane, ear canal and external ear normal.     Mouth/Throat:     Mouth: Mucous membranes are moist.     Pharynx: Oropharynx is clear. No oropharyngeal exudate or posterior oropharyngeal erythema.  Eyes:     General: No scleral icterus.        Right eye: No discharge.        Left eye: No discharge.     Extraocular Movements: Extraocular movements intact.     Conjunctiva/sclera: Conjunctivae normal.     Pupils: Pupils are equal, round, and reactive to light.  Neck:     Vascular: No carotid bruit.  Cardiovascular:     Rate and Rhythm: Normal rate and regular rhythm.  Pulmonary:     Effort: Pulmonary effort is normal.     Breath sounds: Normal breath sounds.  Abdominal:     General: Bowel sounds are normal.  Musculoskeletal:     Cervical back: No rigidity or tenderness.  Lymphadenopathy:     Cervical: No cervical adenopathy.  Skin:    General: Skin is warm and dry.  Neurological:     Mental Status: He is alert and oriented to person, place, and time.  Psychiatric:        Mood and Affect: Mood normal.        Behavior: Behavior normal.    BP 128/76 (BP Location: Left Arm, Patient Position: Sitting, Cuff Size: Normal)   Pulse (!) 56   Temp (!) 97 F (36.1 C) (Temporal)   Ht 5\' 11"  (1.803 m)   Wt 188 lb 12.8 oz (85.6 kg)   SpO2 93%   BMI 26.33 kg/m  Wt Readings from Last 3 Encounters:  06/13/21 188 lb 12.8 oz (85.6 kg)  02/28/21 191 lb (86.6 kg)  12/27/20 187 lb (84.8 kg)     Health Maintenance Due  Topic Date Due   HIV Screening  Never done   Hepatitis C Screening  Never done   Zoster Vaccines- Shingrix (1 of 2) Never done    There are no preventive care reminders to display for this patient.  Lab Results  Component Value Date   TSH 1.23 12/08/2020   Lab Results  Component Value Date   WBC 6.9 12/08/2020   HGB 16.3 12/08/2020   HCT 47.8 12/08/2020   MCV 97.4 12/08/2020   PLT 266.0 12/08/2020   Lab Results  Component Value Date   NA 140 12/08/2020   K 4.9 12/08/2020   CO2 29 12/08/2020   GLUCOSE 97 12/08/2020   BUN 14 12/08/2020   CREATININE 0.85 12/08/2020   BILITOT 0.4 02/09/2021   ALKPHOS 57 02/09/2021   AST 27 02/09/2021   ALT 26 02/09/2021   PROT 7.3 02/09/2021   ALBUMIN 5.0  (H) 02/09/2021   CALCIUM 10.4 12/08/2020   GFR 98.70 12/08/2020   Lab Results  Component Value Date   CHOL 172 02/09/2021   Lab Results  Component Value Date   HDL 40 02/09/2021  Lab Results  Component Value Date   LDLCALC 94 02/09/2021   Lab Results  Component Value Date   TRIG 225 (H) 02/09/2021   Lab Results  Component Value Date   CHOLHDL 4.3 02/09/2021   No results found for: HGBA1C    Assessment & Plan:   Problem List Items Addressed This Visit       Cardiovascular and Mediastinum   Essential hypertension   Relevant Medications   propranolol ER (INDERAL LA) 80 MG 24 hr capsule   rosuvastatin (CRESTOR) 20 MG tablet   lisinopril (ZESTRIL) 20 MG tablet   Other Relevant Orders   CBC   Comprehensive metabolic panel   Urinalysis, Routine w reflex microscopic   Microalbumin / creatinine urine ratio     Other   Palpitations   Relevant Medications   propranolol ER (INDERAL LA) 80 MG 24 hr capsule   Hyperlipidemia   Relevant Medications   propranolol ER (INDERAL LA) 80 MG 24 hr capsule   rosuvastatin (CRESTOR) 20 MG tablet   lisinopril (ZESTRIL) 20 MG tablet   Other Relevant Orders   Lipid panel   Comprehensive metabolic panel   Other Visit Diagnoses     Need for influenza vaccination    -  Primary   Relevant Orders   Flu Vaccine QUAD 6+ mos PF IM (Fluarix Quad PF) (Completed)       Meds ordered this encounter  Medications   propranolol ER (INDERAL LA) 80 MG 24 hr capsule    Sig: Take one daily    Dispense:  90 capsule    Refill:  1   rosuvastatin (CRESTOR) 20 MG tablet    Sig: Take 1 tablet (20 mg total) by mouth daily.    Dispense:  90 tablet    Refill:  1   lisinopril (ZESTRIL) 20 MG tablet    Sig: Take 1 tablet (20 mg total) by mouth daily.    Dispense:  90 tablet    Refill:  1     Follow-up: Return in about 6 months (around 12/11/2021).  Information was given on managing high cholesterol.  Advised bi valent COVID-vaccine.  [name], MD

## 2021-08-25 ENCOUNTER — Other Ambulatory Visit: Payer: Self-pay | Admitting: Family

## 2021-08-25 DIAGNOSIS — R002 Palpitations: Secondary | ICD-10-CM

## 2021-08-25 DIAGNOSIS — I10 Essential (primary) hypertension: Secondary | ICD-10-CM

## 2021-09-10 ENCOUNTER — Ambulatory Visit: Payer: 59 | Admitting: Cardiology

## 2021-09-11 ENCOUNTER — Ambulatory Visit (INDEPENDENT_AMBULATORY_CARE_PROVIDER_SITE_OTHER): Payer: 59 | Admitting: Cardiology

## 2021-09-11 ENCOUNTER — Other Ambulatory Visit: Payer: Self-pay

## 2021-09-11 VITALS — BP 138/92 | HR 69 | Ht 71.0 in | Wt 199.0 lb

## 2021-09-11 DIAGNOSIS — I10 Essential (primary) hypertension: Secondary | ICD-10-CM | POA: Diagnosis not present

## 2021-09-11 DIAGNOSIS — E782 Mixed hyperlipidemia: Secondary | ICD-10-CM

## 2021-09-11 DIAGNOSIS — I7121 Aneurysm of the ascending aorta, without rupture: Secondary | ICD-10-CM | POA: Diagnosis not present

## 2021-09-11 DIAGNOSIS — R002 Palpitations: Secondary | ICD-10-CM

## 2021-09-11 NOTE — Progress Notes (Signed)
Cardiology Office Note:    Date:  09/11/2021   ID:  Phillip Gay, DOB 03-16-1966, MRN [id number]  PCP:  [name], MD  Cardiologist:  Gypsy Balsam, MD    Referring MD: [name],*   Chief Complaint  Patient presents with   Medication Management  I am doing fine  History of Present Illness:    Phillip Gay is a 55 y.o. male who was originally referred to [place] because of heart murmur echocardiogram showed only trivial mitral regurgitation.  Surprisingly he was find to have mild enlargement of the ascending aorta measuring 38 mm.  After that he did have CT of his chest to look at the entire aorta was negative, she also have ultrasounds of his abdomen to look for abdominal arctic aneurysm which was negative as well.  He is coming today to my office for follow-up.  Overall he is doing very well.  Trying to be active.  He is disappointed because he gained some weight.  He denies have any chest pain tightness squeezing pressure burning chest.  Past Medical History:  Diagnosis Date   Essential hypertension 12/08/2020   Healthcare maintenance 12/08/2020   Heart murmur 12/27/2020   Hemorrhoids 09/23/2008   High cholesterol 09/23/1998   History of weight loss 12/08/2020   Hyperlipidemia    Hypertension    Irregular heartbeat 09/23/2008   Palpitations 12/08/2020    Past Surgical History:  Procedure Laterality Date   ANKLE FRACTURE SURGERY Left    HERNIA REPAIR      Current Medications: Current Meds  Medication Sig   lisinopril (ZESTRIL) 20 MG tablet Take 1 tablet (20 mg total) by mouth daily.   Multiple Vitamins-Minerals (MULTIVITAL PO) Take 1 tablet by mouth daily. Unknown strength   niacin (NIASPAN) 500 MG CR tablet Take 500 mg by mouth in the morning.   propranolol ER (INDERAL LA) 80 MG 24 hr capsule Take 80 mg by mouth daily.   rosuvastatin (CRESTOR) 20 MG tablet Take 1 tablet (20 mg total) by mouth daily.     Allergies:   Patient has no known  allergies.   Social History   Socioeconomic History   Marital status: Married    Spouse name: Not on file   Number of children: Not on file   Years of education: Not on file   Highest education level: Not on file  Occupational History   Not on file  Tobacco Use   Smoking status: Former   Smokeless tobacco: Never  Vaping Use   Vaping Use: Never used  Substance and Sexual Activity   Alcohol use: Yes    Alcohol/week: 2.0 standard drinks    Types: 1 Cans of beer, 1 Standard drinks or equivalent per week   Drug use: Never   Sexual activity: Yes  Other Topics Concern   Not on file  Social History Narrative   Not on file   Social Determinants of Health   Financial Resource Strain: Not on file  Food Insecurity: Not on file  Transportation Needs: Not on file  Physical Activity: Not on file  Stress: Not on file  Social Connections: Not on file     Family History: The patient's family history includes Cancer in his father; Hypertension in his father. ROS:   Please see the history of present illness.    All 14 point review of systems negative except as described per history of present illness  EKGs/Labs/Other Studies Reviewed:      Recent  Labs: 12/08/2020: TSH 1.23 06/13/2021: ALT 58; BUN 10; Creatinine, Ser 0.94; Hemoglobin 16.6; Platelets 225.0; Potassium 4.4; Sodium 140  Recent Lipid Panel    Component Value Date/Time   CHOL 175 06/13/2021 1031   CHOL 172 02/09/2021 0917   TRIG 194.0 (H) 06/13/2021 1031   HDL 46.50 06/13/2021 1031   HDL 40 02/09/2021 0917   CHOLHDL 4 06/13/2021 1031   VLDL 38.8 06/13/2021 1031   LDLCALC 89 06/13/2021 1031   LDLCALC 94 02/09/2021 0917    Physical Exam:    VS:  BP (!) 138/92 (BP Location: Right Arm, Patient Position: Sitting)    Pulse 69    Ht 5\' 11"  (1.803 m)    Wt 199 lb (90.3 kg)    SpO2 94%    BMI 27.75 kg/m     Wt Readings from Last 3 Encounters:  09/11/21 199 lb (90.3 kg)  06/13/21 188 lb 12.8 oz (85.6 kg)  02/28/21  191 lb (86.6 kg)     GEN:  Well nourished, well developed in no acute distress HEENT: Normal NECK: No JVD; No carotid bruits LYMPHATICS: No lymphadenopathy CARDIAC: RRR, no murmurs, no rubs, no gallops RESPIRATORY:  Clear to auscultation without rales, wheezing or rhonchi  ABDOMEN: Soft, non-tender, non-distended MUSCULOSKELETAL:  No edema; No deformity  SKIN: Warm and dry LOWER EXTREMITIES: no swelling NEUROLOGIC:  Alert and oriented x 3 PSYCHIATRIC:  Normal affect   ASSESSMENT:    1. Aneurysm of ascending aorta without rupture   2. Essential hypertension   3. Mixed hyperlipidemia   4. Palpitations    PLAN:    In order of problems listed above:  Enlargement of the ascending aorta only 38 mm in ascending portion therefore will be able to follow-up with ultrasound.  Of course the key is risk factors modification avoidance isovolumetric exercises as well as good blood pressure control which we are having difficulty accomplishing. Palpitations he was given propranolol with very good results he keep taking it.  I also want him and cautioned him about avoidance of too much caffeinated drinks. Essential hypertension elevated today wanted to add some medication to that however he said that he is going to start eating better and lose some weight control if he will be able to accomplish that blood pressure be better controlled.  I asked him to check his blood pressure on the regular basis bring it to me next time when he will be here. Dyslipidemia his K PN show me his LDL of 89 and HDL 46.  Continue present management.  He apparently started taking aspirin I told him that benefits of this approach at least doubtful.   Medication Adjustments/Labs and Tests Ordered: Current medicines are reviewed at length with the patient today.  Concerns regarding medicines are outlined above.  No orders of the defined types were placed in this encounter.  Medication changes: No orders of the defined  types were placed in this encounter.   Signed, 04/30/21, MD, [facility] 09/11/2021 3:59 PM    [facility]

## 2021-09-11 NOTE — Patient Instructions (Signed)
Medication Instructions:  Your physician recommends that you continue on your current medications as directed. Please refer to the Current Medication list given to you today.  *If you need a refill on your cardiac medications before your next appointment, please call your pharmacy*   Lab Work: None ordered If you have labs (blood work) drawn today and your tests are completely normal, you will receive your results only by: MyChart Message (if you have MyChart) OR A paper copy in the mail If you have any lab test that is abnormal or we need to change your treatment, we will call you to review the results.   Testing/Procedures: None ordered   Follow-Up: At [facility], you and your health needs are our priority.  As part of our continuing mission to provide you with exceptional heart care, we have created designated Provider Care Teams.  These Care Teams include your primary Cardiologist (physician) and Advanced Practice Providers (APPs -  Physician Assistants and Nurse Practitioners) who all work together to provide you with the care you need, when you need it.  We recommend signing up for the patient portal called "MyChart".  Sign up information is provided on this After Visit Summary.  MyChart is used to connect with patients for Virtual Visits (Telemedicine).  Patients are able to view lab/test results, encounter notes, upcoming appointments, etc.  Non-urgent messages can be sent to your provider as well.   To learn more about what you can do with MyChart, go to [email or web address].    Your next appointment:   3 month(s)  The format for your next appointment:   In Person  Provider:   Robert Krasowski, MD   Other Instructions NA  

## 2021-12-10 ENCOUNTER — Other Ambulatory Visit: Payer: Self-pay | Admitting: Family Medicine

## 2021-12-10 DIAGNOSIS — E782 Mixed hyperlipidemia: Secondary | ICD-10-CM

## 2021-12-11 ENCOUNTER — Encounter: Payer: Self-pay | Admitting: Family Medicine

## 2021-12-11 ENCOUNTER — Ambulatory Visit (INDEPENDENT_AMBULATORY_CARE_PROVIDER_SITE_OTHER): Payer: 59 | Admitting: Family Medicine

## 2021-12-11 ENCOUNTER — Other Ambulatory Visit: Payer: Self-pay

## 2021-12-11 VITALS — BP 120/80 | HR 60 | Temp 97.1°F | Ht 71.0 in | Wt 181.4 lb

## 2021-12-11 DIAGNOSIS — R748 Abnormal levels of other serum enzymes: Secondary | ICD-10-CM | POA: Diagnosis not present

## 2021-12-11 DIAGNOSIS — Z125 Encounter for screening for malignant neoplasm of prostate: Secondary | ICD-10-CM | POA: Diagnosis not present

## 2021-12-11 DIAGNOSIS — E782 Mixed hyperlipidemia: Secondary | ICD-10-CM | POA: Diagnosis not present

## 2021-12-11 DIAGNOSIS — I10 Essential (primary) hypertension: Secondary | ICD-10-CM | POA: Diagnosis not present

## 2021-12-11 LAB — LIPID PANEL
Cholesterol: 145 mg/dL (ref 0–200)
HDL: 37.9 mg/dL — ABNORMAL LOW (ref >39.00)
LDL Cholesterol: 84 mg/dL (ref 0–99)
NonHDL: 106.92
Total CHOL/HDL Ratio: 4
Triglycerides: 113 mg/dL (ref 0.0–149.0)
VLDL: 22.6 mg/dL (ref 0.0–40.0)

## 2021-12-11 LAB — BASIC METABOLIC PANEL
BUN: 14 mg/dL (ref 6–23)
CO2: 30 mEq/L (ref 19–32)
Calcium: 10.2 mg/dL (ref 8.4–10.5)
Chloride: 99 mEq/L (ref 96–112)
Creatinine, Ser: 1.03 mg/dL (ref 0.40–1.50)
GFR: 81.93 mL/min (ref >60.00)
Glucose, Bld: 99 mg/dL (ref 70–99)
Potassium: 4.8 mEq/L (ref 3.5–5.1)
Sodium: 137 mEq/L (ref 135–145)

## 2021-12-11 LAB — CBC
HCT: 47 % (ref 39.0–52.0)
Hemoglobin: 15.9 g/dL (ref 13.0–17.0)
MCHC: 33.7 g/dL (ref 30.0–36.0)
MCV: 98.2 fl (ref 78.0–100.0)
Platelets: 248 10*3/uL (ref 150.0–400.0)
RBC: 4.79 Mil/uL (ref 4.22–5.81)
RDW: 13.2 % (ref 11.5–15.5)
WBC: 6.2 10*3/uL (ref 4.0–10.5)

## 2021-12-11 LAB — HEPATIC FUNCTION PANEL
ALT: 20 U/L (ref 0–53)
AST: 19 U/L (ref 0–37)
Albumin: 5.2 g/dL (ref 3.5–5.2)
Alkaline Phosphatase: 42 U/L (ref 39–117)
Bilirubin, Direct: 0.2 mg/dL (ref 0.0–0.3)
Total Bilirubin: 0.8 mg/dL (ref 0.2–1.2)
Total Protein: 7.6 g/dL (ref 6.0–8.3)

## 2021-12-11 LAB — PSA: PSA: 1.01 ng/mL (ref 0.10–4.00)

## 2021-12-11 NOTE — Progress Notes (Signed)
? ?Established Patient Office Visit ? ?Subjective:  ?Patient ID: Phillip Gay, male    DOB: Jun 27, 1966  Age: 56 y.o. MRN: [id number] ? ?CC:  ?Chief Complaint  ?Patient presents with  ? Follow-up  ?  6 month follow No concerns. Pt is fasting  ? ? ?HPI ?Phillip Gay presents for follow-up of elevated liver enzymes, hypertension and hyperlipidemia.  Patient has decreased fat and cholesterol in his diet.  He rarely drinks alcohol.  Discontinued Niaspan secondary to poor outcomes data.  Blood pressure has been well controlled on current regimen.  He is doing well with rosuvastatin. ? ?Past Medical History:  ?Diagnosis Date  ? Essential hypertension 12/08/2020  ? Healthcare maintenance 12/08/2020  ? Heart murmur 12/27/2020  ? Hemorrhoids 09/23/2008  ? High cholesterol 09/23/1998  ? History of weight loss 12/08/2020  ? Hyperlipidemia   ? Hypertension   ? Irregular heartbeat 09/23/2008  ? Palpitations 12/08/2020  ? ? ?Past Surgical History:  ?Procedure Laterality Date  ? ANKLE FRACTURE SURGERY Left   ? HERNIA REPAIR    ? ? ?Family History  ?Problem Relation Age of Onset  ? Hypertension Father   ? Cancer Father   ? ? ?Social History  ? ?Socioeconomic History  ? Marital status: Married  ?  Spouse name: Not on file  ? Number of children: Not on file  ? Years of education: Not on file  ? Highest education level: Not on file  ?Occupational History  ? Not on file  ?Tobacco Use  ? Smoking status: Former  ? Smokeless tobacco: Never  ?Vaping Use  ? Vaping Use: Never used  ?Substance and Sexual Activity  ? Alcohol use: Yes  ?  Alcohol/week: 2.0 standard drinks  ?  Types: 1 Cans of beer, 1 Standard drinks or equivalent per week  ? Drug use: Never  ? Sexual activity: Yes  ?Other Topics Concern  ? Not on file  ?Social History Narrative  ? Not on file  ? ?Social Determinants of Health  ? ?Financial Resource Strain: Not on file  ?Food Insecurity: Not on file  ?Transportation Needs: Not on file  ?Physical Activity: Not on file  ?Stress:  Not on file  ?Social Connections: Not on file  ?Intimate Partner Violence: Not on file  ? ? ?Outpatient Medications Prior to Visit  ?Medication Sig Dispense Refill  ? lisinopril (ZESTRIL) 20 MG tablet Take 1 tablet (20 mg total) by mouth daily. 90 tablet 1  ? Multiple Vitamins-Minerals (MULTIVITAL PO) Take 1 tablet by mouth daily. Unknown strength    ? propranolol ER (INDERAL LA) 80 MG 24 hr capsule Take 80 mg by mouth daily.    ? rosuvastatin (CRESTOR) 20 MG tablet TAKE 1 TABLET BY MOUTH EVERY DAY 90 tablet 1  ? niacin (NIASPAN) 500 MG CR tablet Take 500 mg by mouth in the morning.    ? ?No facility-administered medications prior to visit.  ? ? ?No Known Allergies ? ?ROS ?Review of Systems  ?Constitutional:  Negative for chills, diaphoresis, fatigue, fever and unexpected weight change.  ?HENT: Negative.    ?Eyes:  Negative for photophobia and visual disturbance.  ?Respiratory: Negative.    ?Cardiovascular: Negative.   ?Gastrointestinal: Negative.  Negative for anal bleeding, blood in stool and constipation.  ?Endocrine: Negative for polyphagia and polyuria.  ?Genitourinary: Negative.  Negative for difficulty urinating, frequency and urgency.  ?Musculoskeletal:  Negative for gait problem.  ?Neurological:  Negative for speech difficulty and weakness.  ? ?  ?Objective:  ?  ?  Physical Exam ?Vitals and nursing note reviewed.  ?Constitutional:   ?   General: He is not in acute distress. ?   Appearance: Normal appearance. He is not ill-appearing, toxic-appearing or diaphoretic.  ?HENT:  ?   Head: Normocephalic and atraumatic.  ?   Right Ear: External ear normal.  ?   Left Ear: External ear normal.  ?   Mouth/Throat:  ?   Mouth: Mucous membranes are moist.  ?   Pharynx: Oropharynx is clear. No oropharyngeal exudate or posterior oropharyngeal erythema.  ?Eyes:  ?   General: No scleral icterus.    ?   Right eye: No discharge.     ?   Left eye: No discharge.  ?   Extraocular Movements: Extraocular movements intact.  ?    Conjunctiva/sclera: Conjunctivae normal.  ?Cardiovascular:  ?   Rate and Rhythm: Normal rate and regular rhythm.  ?Pulmonary:  ?   Effort: Pulmonary effort is normal.  ?   Breath sounds: Normal breath sounds.  ?Abdominal:  ?   General: Bowel sounds are normal.  ?Musculoskeletal:  ?   Cervical back: No rigidity or tenderness.  ?Lymphadenopathy:  ?   Cervical: No cervical adenopathy.  ?Skin: ?   General: Skin is warm and dry.  ?Neurological:  ?   Mental Status: He is alert and oriented to person, place, and time.  ?Psychiatric:     ?   Mood and Affect: Mood normal.     ?   Behavior: Behavior normal.  ? ? ?BP 120/80 (BP Location: Left Arm, Patient Position: Sitting, Cuff Size: Normal)   Pulse 60   Temp (!) 97.1 ?F (36.2 ?C) (Temporal)   Ht 5\' 11"  (1.803 m)   Wt 181 lb 6.4 oz (82.3 kg)   SpO2 98%   BMI 25.30 kg/m?  ?Wt Readings from Last 3 Encounters:  ?12/11/21 181 lb 6.4 oz (82.3 kg)  ?09/11/21 199 lb (90.3 kg)  ?06/13/21 188 lb 12.8 oz (85.6 kg)  ? ? ? ?Health Maintenance Due  ?Topic Date Due  ? HIV Screening  Never done  ? Hepatitis C Screening  Never done  ? Zoster Vaccines- Shingrix (1 of 2) Never done  ? ? ?There are no preventive care reminders to display for this patient. ? ?Lab Results  ?Component Value Date  ? TSH 1.23 12/08/2020  ? ?Lab Results  ?Component Value Date  ? WBC 5.4 06/13/2021  ? HGB 16.6 06/13/2021  ? HCT 49.6 06/13/2021  ? MCV 98.7 06/13/2021  ? PLT 225.0 06/13/2021  ? ?Lab Results  ?Component Value Date  ? NA 140 06/13/2021  ? K 4.4 06/13/2021  ? CO2 28 06/13/2021  ? GLUCOSE 100 (H) 06/13/2021  ? BUN 10 06/13/2021  ? CREATININE 0.94 06/13/2021  ? BILITOT 0.6 06/13/2021  ? ALKPHOS 39 06/13/2021  ? AST 38 (H) 06/13/2021  ? ALT 58 (H) 06/13/2021  ? PROT 7.9 06/13/2021  ? ALBUMIN 5.2 06/13/2021  ? CALCIUM 10.3 06/13/2021  ? GFR 91.75 06/13/2021  ? ?Lab Results  ?Component Value Date  ? CHOL 175 06/13/2021  ? ?Lab Results  ?Component Value Date  ? HDL 46.50 06/13/2021  ? ?Lab Results   ?Component Value Date  ? LDLCALC 89 06/13/2021  ? ?Lab Results  ?Component Value Date  ? TRIG 194.0 (H) 06/13/2021  ? ?Lab Results  ?Component Value Date  ? CHOLHDL 4 06/13/2021  ? ?No results found for: HGBA1C ? ?  ?Assessment & Plan:  ? ?Problem  List Items Addressed This Visit   ? ?  ? Cardiovascular and Mediastinum  ? Essential hypertension - Primary  ? Relevant Orders  ? Basic metabolic panel  ? CBC  ?  ? Other  ? Mixed hyperlipidemia  ? Relevant Orders  ? Lipid panel  ? Elevated liver enzymes  ? Relevant Orders  ? Hepatitis C antibody  ? HIV Antibody (routine testing w rflx)  ? Hepatic function panel  ? Hepatitis B Surface AntiGEN  ? ?Other Visit Diagnoses   ? ? Screening for prostate cancer      ? Relevant Orders  ? PSA  ? ?  ? ? ?No orders of the defined types were placed in this encounter. ? ? ?Follow-up: Return in about 6 months (around 06/13/2022).  ?Continue low-fat diet.  Information on the Mediterranean diet was given.  May consider hepatic ultrasound. ? ?[name], MD ?

## 2021-12-12 LAB — HEPATITIS C ANTIBODY
Hepatitis C Ab: NONREACTIVE
SIGNAL TO CUT-OFF: <0.02 (ref <1.00)

## 2021-12-12 LAB — HIV ANTIBODY (ROUTINE TESTING W REFLEX): HIV 1&2 Ab, 4th Generation: NONREACTIVE

## 2021-12-12 LAB — HEPATITIS B SURFACE ANTIGEN: Hepatitis B Surface Ag: NONREACTIVE

## 2021-12-13 ENCOUNTER — Ambulatory Visit (INDEPENDENT_AMBULATORY_CARE_PROVIDER_SITE_OTHER): Payer: 59 | Admitting: Cardiology

## 2021-12-13 ENCOUNTER — Other Ambulatory Visit: Payer: Self-pay

## 2021-12-13 ENCOUNTER — Encounter: Payer: Self-pay | Admitting: Cardiology

## 2021-12-13 VITALS — BP 116/78 | HR 59 | Ht 71.0 in | Wt 183.0 lb

## 2021-12-13 DIAGNOSIS — I10 Essential (primary) hypertension: Secondary | ICD-10-CM | POA: Diagnosis not present

## 2021-12-13 DIAGNOSIS — E7800 Pure hypercholesterolemia, unspecified: Secondary | ICD-10-CM | POA: Diagnosis not present

## 2021-12-13 DIAGNOSIS — I77810 Thoracic aortic ectasia: Secondary | ICD-10-CM | POA: Diagnosis not present

## 2021-12-13 NOTE — Progress Notes (Signed)
?Cardiology Office Note:   ? ?Date:  12/13/2021  ? ?ID:  Phillip Gay, DOB 1966/09/13, MRN [id number] ? ?PCP:  [name], MD  ?Cardiologist:  Gypsy Balsam, MD   ? ?Referring MD: [name],*  ? ?Chief Complaint  ?Patient presents with  ? BP monitoring   ? ? ?History of Present Illness:   ? ?Phillip Gay is a 56 y.o. 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  He comes today 2 months of follow-up.  Since I have seen him last time he started exercising on the regular basis dropped 16 pounds started feeling much better denies have any palpitation chest pain tightness squeezing pressure burning chest, he brought measurements of blood pressure and those are looking much better. ? ?Past Medical History:  ?Diagnosis Date  ? Essential hypertension 12/08/2020  ? Healthcare maintenance 12/08/2020  ? Heart murmur 12/27/2020  ? Hemorrhoids 09/23/2008  ? High cholesterol 09/23/1998  ? History of weight loss 12/08/2020  ? Hyperlipidemia   ? Hypertension   ? Irregular heartbeat 09/23/2008  ? Palpitations 12/08/2020  ? ? ?Past Surgical History:  ?Procedure Laterality Date  ? ANKLE FRACTURE SURGERY Left   ? HERNIA REPAIR    ? ? ?Current Medications: ?Current Meds  ?Medication Sig  ? lisinopril (ZESTRIL) 20 MG tablet Take 1 tablet (20 mg total) by mouth daily.  ? Multiple Vitamins-Minerals (MULTIVITAL PO) Take 1 tablet by mouth daily. Unknown strength  ? propranolol ER (INDERAL LA) 80 MG 24 hr capsule Take 80 mg by mouth daily.  ? rosuvastatin (CRESTOR) 20 MG tablet TAKE 1 TABLET BY MOUTH EVERY DAY (Patient taking differently: Take 20 mg by mouth daily.)  ?  ? ?Allergies:   Patient has no known allergies.  ? ?Social History  ? ?Socioeconomic History  ? Marital status:  Married  ?  Spouse name: Not on file  ? Number of children: Not on file  ? Years of education: Not on file  ? Highest education level: Not on file  ?Occupational History  ? Not on file  ?Tobacco Use  ? Smoking status: Former  ? Smokeless tobacco: Never  ?Vaping Use  ? Vaping Use: Never used  ?Substance and Sexual Activity  ? Alcohol use: Yes  ?  Alcohol/week: 2.0 standard drinks  ?  Types: 1 Cans of beer, 1 Standard drinks or equivalent per week  ? Drug use: Never  ? Sexual activity: Yes  ?Other Topics Concern  ? Not on file  ?Social History Narrative  ? Not on file  ? ?Social Determinants of Health  ? ?Financial Resource Strain: Not on file  ?Food Insecurity: Not on file  ?Transportation Needs: Not on file  ?Physical Activity: Not on file  ?Stress: Not on file  ?Social Connections: Not on file  ?  ? ?Family History: ?The patient's family history includes Cancer in his father; Hypertension in his father. ?ROS:   ?Please see the history of present illness.    ?All 14 point review of systems negative except as described per history of present illness ? ?EKGs/Labs/Other Studies Reviewed:   ? ? ? ?Recent Labs: ?12/11/2021: ALT 20; BUN 14; Creatinine, Ser 1.03; Hemoglobin 15.9; Platelets 248.0; Potassium 4.8; Sodium 137  ?Recent Lipid Panel ?   ?Component Value Date/Time  ? CHOL  145 12/11/2021 1031  ? CHOL 172 02/09/2021 0917  ? TRIG 113.0 12/11/2021 1031  ? HDL 37.90 (L) 12/11/2021 1031  ? HDL 40 02/09/2021 0917  ? CHOLHDL 4 12/11/2021 1031  ? VLDL 22.6 12/11/2021 1031  ? LDLCALC 84 12/11/2021 1031  ? LDLCALC 94 02/09/2021 0917  ? ? ?Physical Exam:   ? ?VS:  BP 116/78 (BP Location: Left Arm, Patient Position: Sitting)   Pulse (!) 59   Ht 5\' 11"  (1.803 m)   Wt 183 lb (83 kg)   SpO2 94%   BMI 25.52 kg/m?    ? ?Wt Readings from Last 3 Encounters:  ?12/13/21 183 lb (83 kg)  ?12/11/21 181 lb 6.4 oz (82.3 kg)  ?09/11/21 199 lb (90.3 kg)  ?  ? ?GEN:  Well nourished, well developed in no acute distress ?HEENT: Normal ?NECK:  No JVD; No carotid bruits ?LYMPHATICS: No lymphadenopathy ?CARDIAC: RRR, no murmurs, no rubs, no gallops ?RESPIRATORY:  Clear to auscultation without rales, wheezing or rhonchi  ?ABDOMEN: Soft, non-tender, non-distended ?MUSCULOSKELETAL:  No edema; No deformity  ?SKIN: Warm and dry ?LOWER EXTREMITIES: no swelling ?NEUROLOGIC:  Alert and oriented x 3 ?PSYCHIATRIC:  Normal affect  ? ?ASSESSMENT:   ? ?1. Essential hypertension   ?2. 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 High cholesterol   ? ?PLAN:   ? ?In order of problems listed above: ? ?Enlargement of the ascending aorta only 38 mm.  There is a controversy a little bit about this diameter if it is truly enlarged.  If so only mild.  He will require echocardiogram done next year to make sure there is stability of this issues controlling blood pressure which has been already established.  Obviously no intervention needed otherwise. ?Essential hypertension his blood pressure today is good.  He brought blood pressure measurements from home which is good.  Continue present management. ?Dyslipidemia: He is on Crestor 20, I did review his cholesterol from 2 days ago his HDL 37.9, his LDL 84.  Good control continue present management. ?We did talk about healthy lifestyle need to exercise on a regular good diet.  He is doing this already. ? ? ?Medication Adjustments/Labs and Tests Ordered: ?Current medicines are reviewed at length with the patient today.  Concerns regarding medicines are outlined above.  ?No orders of the defined types were placed in this encounter. ? ?Medication changes: No orders of the defined types were placed in this encounter. ? ? ?Signed, ?09/13/21, MD, [facility] ?12/13/2021 9:48 AM    ?[facility] ?

## 2021-12-13 NOTE — Patient Instructions (Signed)

## 2022-04-22 ENCOUNTER — Other Ambulatory Visit: Payer: Self-pay | Admitting: Family Medicine

## 2022-04-22 DIAGNOSIS — I10 Essential (primary) hypertension: Secondary | ICD-10-CM

## 2022-05-23 ENCOUNTER — Other Ambulatory Visit: Payer: Self-pay | Admitting: Family Medicine

## 2022-05-23 DIAGNOSIS — R002 Palpitations: Secondary | ICD-10-CM

## 2022-05-23 DIAGNOSIS — I10 Essential (primary) hypertension: Secondary | ICD-10-CM

## 2022-05-25 IMAGING — CT CT CHEST W/O CM
2 of 4 series · 15 of 36 positions shown, 18 images · non-contrast
Comparison: None.

CLINICAL DATA: Rule out thoracic aortic aneurysm, former smoker

EXAM:
CT CHEST WITHOUT CONTRAST
TECHNIQUE: Multidetector CT imaging of the chest was performed following the
standard protocol without IV contrast.

[Series 2: thorax · axial · 0.77mm/px · z∈[-188,+138]mm · 12 of 191 slices shown, 15 images]
[im 14/191  mediastinal]
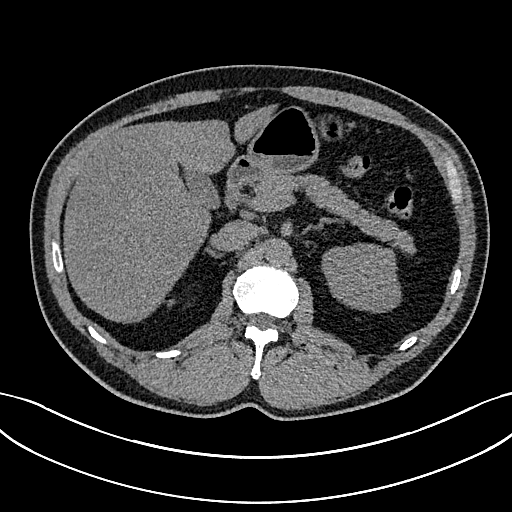
[im 14/191  lung]
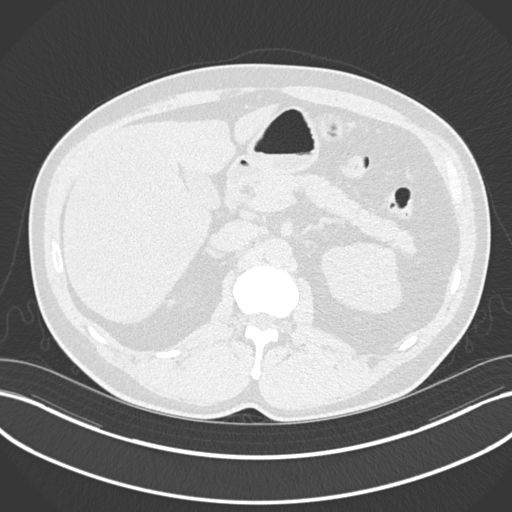
[im 28/191  lung]
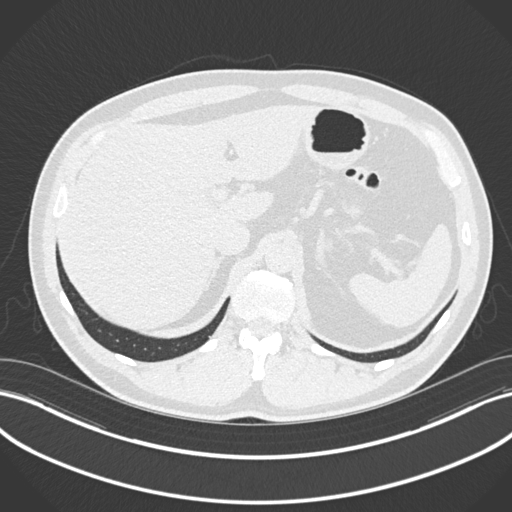
[im 41/191  lung]
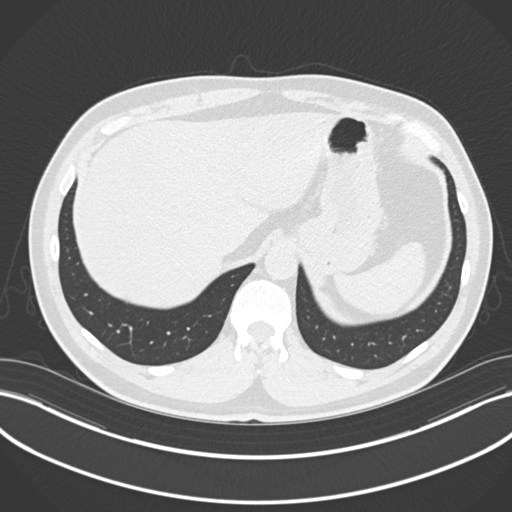
[im 55/191  lung]
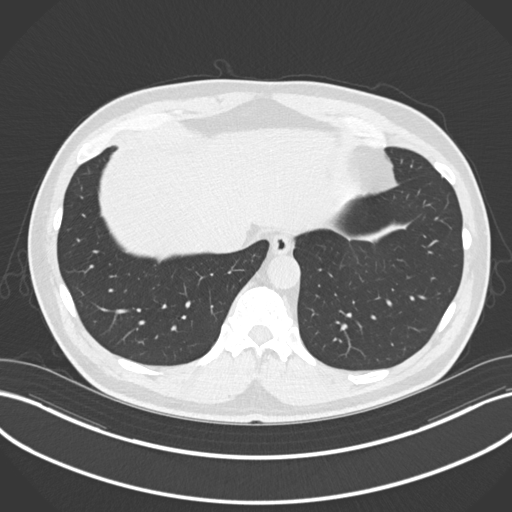
[im 68/191  mediastinal]
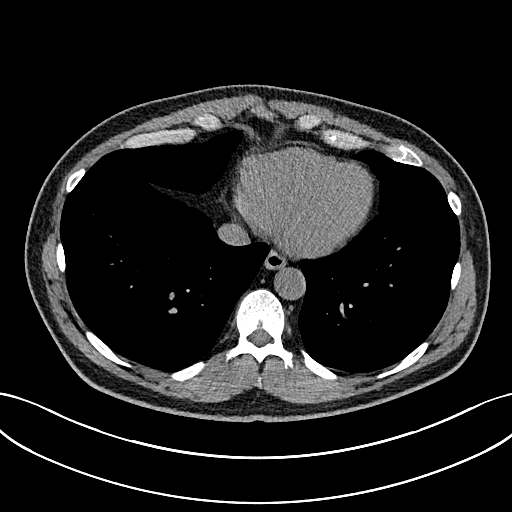
[im 68/191  lung]
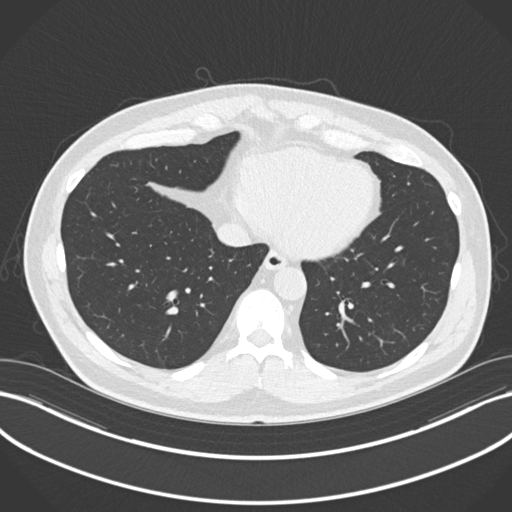
[im 82/191  lung]
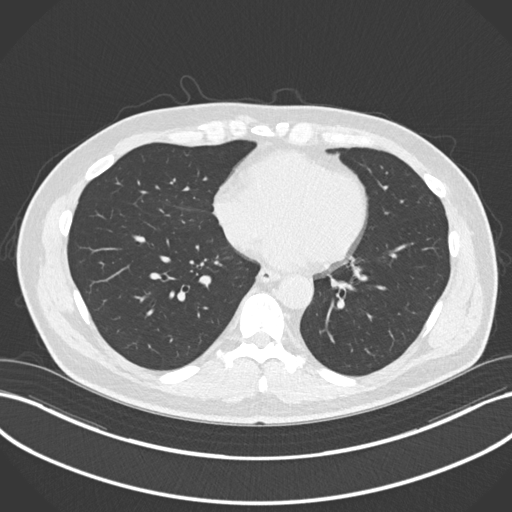
[im 109/191  lung]
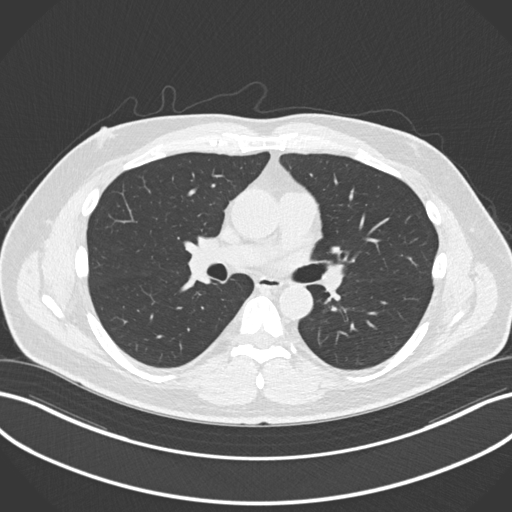
[im 123/191  lung]
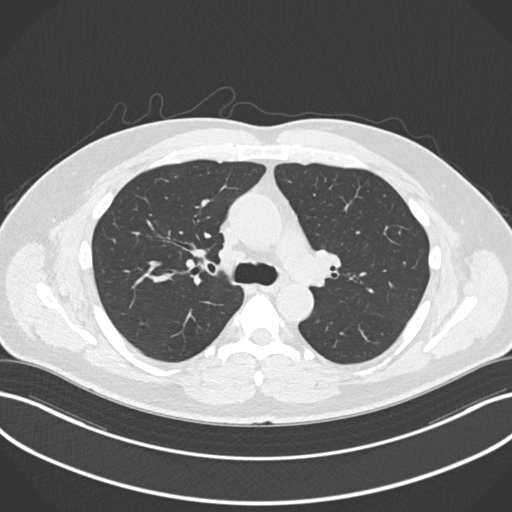
[im 136/191  mediastinal]
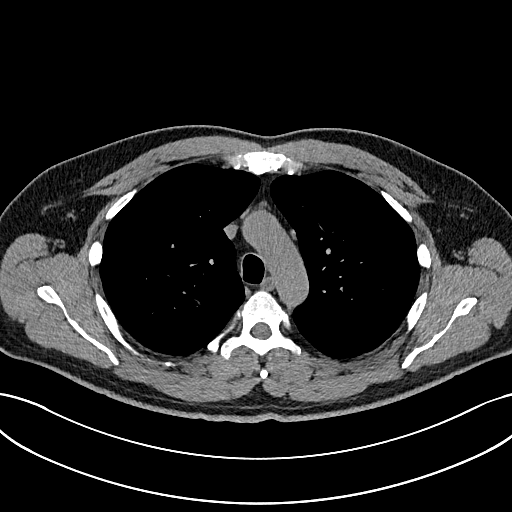
[im 136/191  lung]
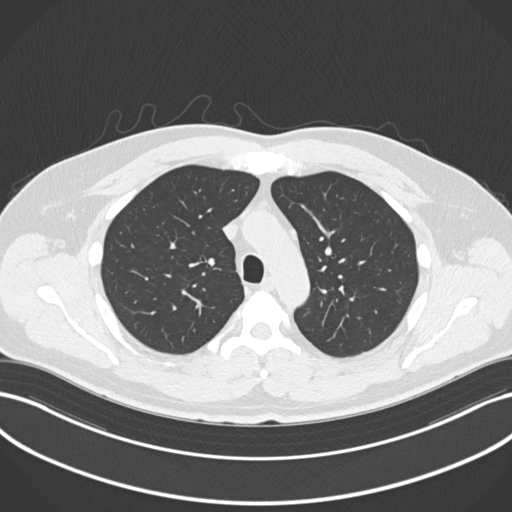
[im 150/191  lung]
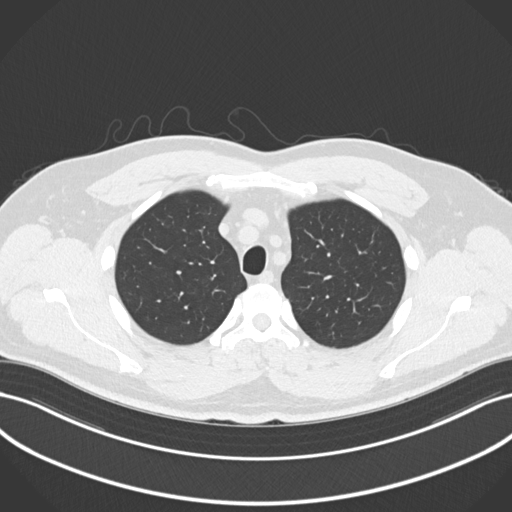
[im 163/191  lung]
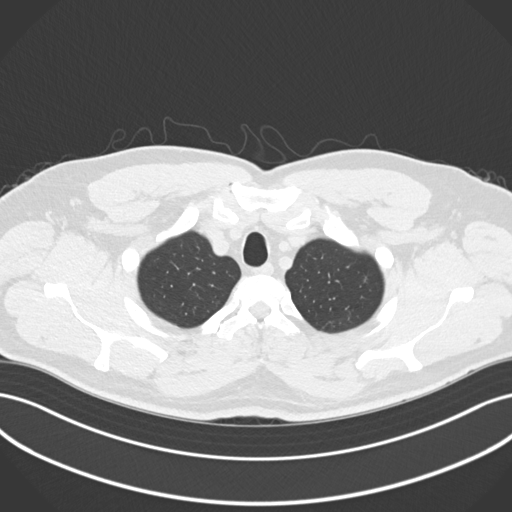
[im 177/191  lung]
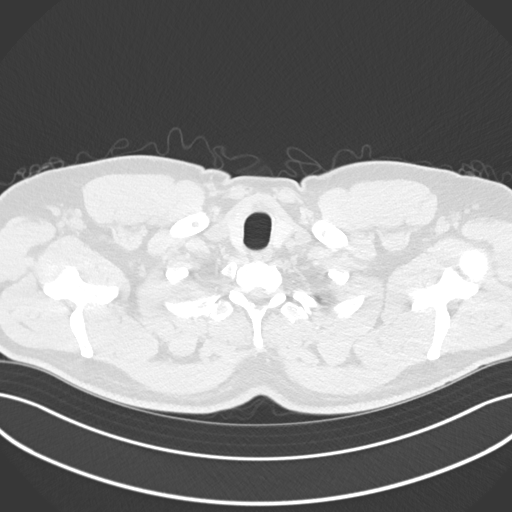

[Series 5: coronal · coronal · 0.74mm/px · 3 of 128 slices shown]
[im 26/128  lung]
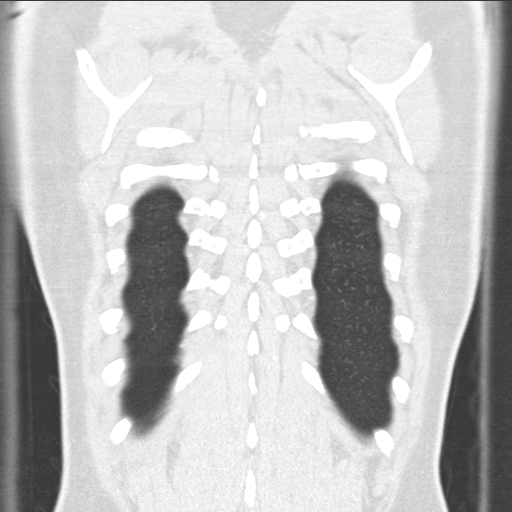
[im 51/128  lung]
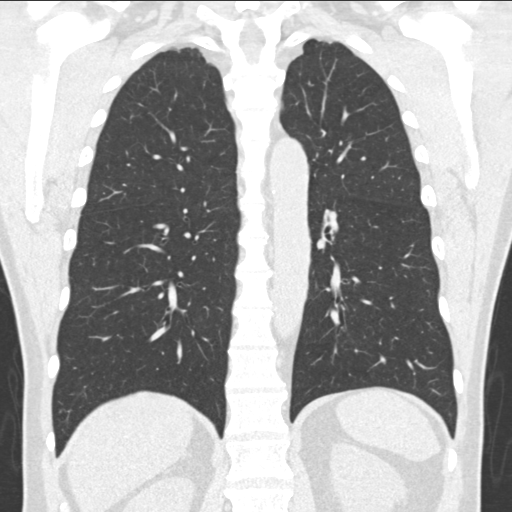
[im 77/128  lung]
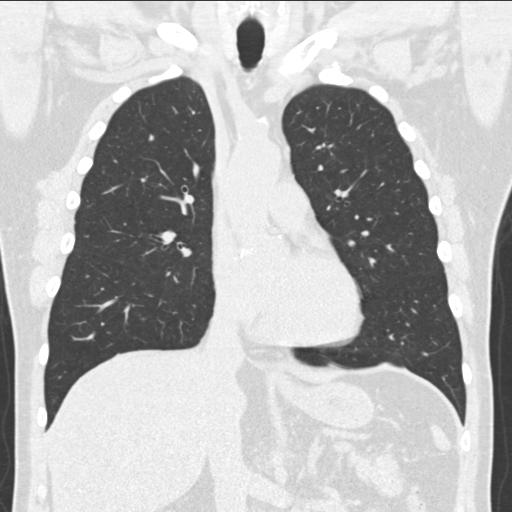

[15 of 36 positions shown; findings below may reference images not displayed]

FINDINGS: Cardiovascular: Scattered aortic atherosclerosis. The tubular
ascending thoracic aorta is at the upper limit of normal in size
measuring 3.8 x 3.7 cm. Normal heart size. No pericardial effusion.

Mediastinum/Nodes: No enlarged mediastinal, hilar, or axillary lymph
nodes. Thyroid gland, trachea, and esophagus demonstrate no
significant findings.

Lungs/Pleura: Lungs are clear. No pleural effusion or pneumothorax.

Upper Abdomen: No acute abnormality.

Musculoskeletal: No chest wall mass or suspicious bone lesions
identified.
IMPRESSION: 1.  Negative examination for thoracic aortic aneurysm.

2.  Aortic Atherosclerosis ([id number]).

## 2022-05-28 ENCOUNTER — Ambulatory Visit: Payer: Commercial Managed Care - HMO | Attending: Cardiology | Admitting: Cardiology

## 2022-05-28 ENCOUNTER — Encounter: Payer: Self-pay | Admitting: Cardiology

## 2022-05-28 VITALS — BP 110/80 | HR 56 | Ht 71.0 in | Wt 182.0 lb

## 2022-05-28 DIAGNOSIS — I10 Essential (primary) hypertension: Secondary | ICD-10-CM | POA: Diagnosis not present

## 2022-05-28 DIAGNOSIS — E782 Mixed hyperlipidemia: Secondary | ICD-10-CM | POA: Diagnosis not present

## 2022-05-28 DIAGNOSIS — R0609 Other forms of dyspnea: Secondary | ICD-10-CM | POA: Diagnosis not present

## 2022-05-28 DIAGNOSIS — I77810 Thoracic aortic ectasia: Secondary | ICD-10-CM | POA: Diagnosis not present

## 2022-05-28 NOTE — Patient Instructions (Addendum)
Medication Instructions:  Your physician recommends that you continue on your current medications as directed. Please refer to the Current Medication list given to you today.  *If you need a refill on your cardiac medications before your next appointment, please call your pharmacy*   Lab Work: None Ordered If you have labs (blood work) drawn today and your tests are completely normal, you will receive your results only by: MyChart Message (if you have MyChart) OR A paper copy in the mail If you have any lab test that is abnormal or we need to change your treatment, we will call you to review the results.   Testing/Procedures: Your physician has requested that you have an echocardiogram. Echocardiography is a painless test that uses sound waves to create images of your heart. It provides your doctor with information about the size and shape of your heart and how well your heart's chambers and valves are working. This procedure takes approximately one hour. There are no restrictions for this procedure.    Follow-Up: At [facility], you and your health needs are our priority.  As part of our continuing mission to provide you with exceptional heart care, we have created designated Provider Care Teams.  These Care Teams include your primary Cardiologist (physician) and Advanced Practice Providers (APPs -  Physician Assistants and Nurse Practitioners) who all work together to provide you with the care you need, when you need it.  We recommend signing up for the patient portal called "MyChart".  Sign up information is provided on this After Visit Summary.  MyChart is used to connect with patients for Virtual Visits (Telemedicine).  Patients are able to view lab/test results, encounter notes, upcoming appointments, etc.  Non-urgent messages can be sent to your provider as well.   To learn more about what you can do with MyChart, go to [email or web address].    Your next appointment:   12  month(s)  The format for your next appointment:   In Person  Provider:   Robert Krasowski, MD    Other Instructions NA  

## 2022-05-28 NOTE — Progress Notes (Signed)
Cardiology Office Note:    Date:  05/28/2022   ID:  Phillip Gay, DOB December 28, 1965, MRN [id number]  PCP:  [name], MD  Cardiologist:  Gypsy Balsam, MD    Referring MD: [name],*   Chief Complaint  Patient presents with   Follow-up  Doing fine  History of Present Illness:    Phillip Gay is a 56 y.o. male with past medical history significant for mild enlargement of ascending aorta only 38 mm, evaluation rest of the aorta did not show any enlargement so the only area of concern is ascending aorta, essential hypertension, dyslipidemia, palpitations. He is in my office for follow-up doing well denies have any chest pain tightness squeezing pressure burning chest, exercise on the regular basis have no difficulty doing it.  Past Medical History:  Diagnosis Date   Essential hypertension 12/08/2020   Healthcare maintenance 12/08/2020   Heart murmur 12/27/2020   Hemorrhoids 09/23/2008   High cholesterol 09/23/1998   History of weight loss 12/08/2020   Hyperlipidemia    Hypertension    Irregular heartbeat 09/23/2008   Palpitations 12/08/2020    Past Surgical History:  Procedure Laterality Date   ANKLE FRACTURE SURGERY Left    HERNIA REPAIR      Current Medications: Current Meds  Medication Sig   lisinopril (ZESTRIL) 20 MG tablet TAKE 1 TABLET BY MOUTH EVERY DAY   Multiple Vitamins-Minerals (MULTIVITAL PO) Take 1 tablet by mouth daily. Unknown strength   propranolol ER (INDERAL LA) 80 MG 24 hr capsule TAKE 1 CAPSULE BY MOUTH EVERY DAY (Patient taking differently: Take 80 mg by mouth daily.)   rosuvastatin (CRESTOR) 20 MG tablet TAKE 1 TABLET BY MOUTH EVERY DAY (Patient taking differently: Take 20 mg by mouth daily.)     Allergies:   Patient has no known allergies.   Social History   Socioeconomic History   Marital status: Married    Spouse name: Not on file   Number of children: Not on file   Years of education: Not on file   Highest  education level: Not on file  Occupational History   Not on file  Tobacco Use   Smoking status: Former   Smokeless tobacco: Never  Vaping Use   Vaping Use: Never used  Substance and Sexual Activity   Alcohol use: Yes    Alcohol/week: 2.0 standard drinks of alcohol    Types: 1 Cans of beer, 1 Standard drinks or equivalent per week   Drug use: Never   Sexual activity: Yes  Other Topics Concern   Not on file  Social History Narrative   Not on file   Social Determinants of Health   Financial Resource Strain: Not on file  Food Insecurity: Not on file  Transportation Needs: Not on file  Physical Activity: Not on file  Stress: Not on file  Social Connections: Not on file     Family History: The patient's family history includes Cancer in his father; Hypertension in his father. ROS:   Please see the history of present illness.    All 14 point review of systems negative except as described per history of present illness  EKGs/Labs/Other Studies Reviewed:      Recent Labs: 12/11/2021: ALT 20; BUN 14; Creatinine, Ser 1.03; Hemoglobin 15.9; Platelets 248.0; Potassium 4.8; Sodium 137  Recent Lipid Panel    Component Value Date/Time   CHOL 145 12/11/2021 1031   CHOL 172 02/09/2021 0917   TRIG 113.0 12/11/2021 1031  HDL 37.90 (L) 12/11/2021 1031   HDL 40 02/09/2021 0917   CHOLHDL 4 12/11/2021 1031   VLDL 22.6 12/11/2021 1031   LDLCALC 84 12/11/2021 1031   LDLCALC 94 02/09/2021 0917    Physical Exam:    VS:  BP 110/80 (BP Location: Left Arm, Patient Position: Sitting)   Pulse (!) 56   Ht 5\' 11"  (1.803 m)   Wt 182 lb (82.6 kg)   SpO2 97%   BMI 25.38 kg/m     Wt Readings from Last 3 Encounters:  05/28/22 182 lb (82.6 kg)  12/13/21 183 lb (83 kg)  12/11/21 181 lb 6.4 oz (82.3 kg)     GEN:  Well nourished, well developed in no acute distress HEENT: Normal NECK: No JVD; No carotid bruits LYMPHATICS: No lymphadenopathy CARDIAC: RRR, no murmurs, no rubs, no  gallops RESPIRATORY:  Clear to auscultation without rales, wheezing or rhonchi  ABDOMEN: Soft, non-tender, non-distended MUSCULOSKELETAL:  No edema; No deformity  SKIN: Warm and dry LOWER EXTREMITIES: no swelling NEUROLOGIC:  Alert and oriented x 3 PSYCHIATRIC:  Normal affect   ASSESSMENT:    1. Essential hypertension   2. 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 Mixed hyperlipidemia    PLAN:    In order of problems listed above:  Essential hypertension: Blood pressure well controlled continue present management. Mild ascending aorta dilatation.  38 mm.  Only mild next year we will repeat echocardiogram to check the stability of this enlargement. Mixed dyslipidemia I did review his K PN which show LDL 84 HDL 37 we will continue present management. We did talk about healthy lifestyle which she already does he eats well and he is trying to exercise on the regular basis which I congratulated him for doing it I encouraged him to doing this   Medication Adjustments/Labs and Tests Ordered: Current medicines are reviewed at length with the patient today.  Concerns regarding medicines are outlined above.  No orders of the defined types were placed in this encounter.  Medication changes: No orders of the defined types were placed in this encounter.   Signed, 12/13/21, MD, [facility] 05/28/2022 10:12 AM    [facility]

## 2022-06-10 ENCOUNTER — Encounter: Payer: Self-pay | Admitting: Family Medicine

## 2022-06-10 ENCOUNTER — Ambulatory Visit (INDEPENDENT_AMBULATORY_CARE_PROVIDER_SITE_OTHER): Payer: Commercial Managed Care - HMO | Admitting: Family Medicine

## 2022-06-10 VITALS — BP 110/68 | HR 57 | Temp 97.3°F | Ht 71.0 in | Wt 180.4 lb

## 2022-06-10 DIAGNOSIS — E782 Mixed hyperlipidemia: Secondary | ICD-10-CM | POA: Diagnosis not present

## 2022-06-10 DIAGNOSIS — R002 Palpitations: Secondary | ICD-10-CM | POA: Diagnosis not present

## 2022-06-10 DIAGNOSIS — R5383 Other fatigue: Secondary | ICD-10-CM

## 2022-06-10 DIAGNOSIS — Z23 Encounter for immunization: Secondary | ICD-10-CM | POA: Diagnosis not present

## 2022-06-10 DIAGNOSIS — I10 Essential (primary) hypertension: Secondary | ICD-10-CM | POA: Diagnosis not present

## 2022-06-10 LAB — LIPID PANEL
Cholesterol: 147 mg/dL (ref 0–200)
HDL: 37.7 mg/dL — ABNORMAL LOW (ref >39.00)
LDL Cholesterol: 87 mg/dL (ref 0–99)
NonHDL: 109.54
Total CHOL/HDL Ratio: 4
Triglycerides: 111 mg/dL (ref 0.0–149.0)
VLDL: 22.2 mg/dL (ref 0.0–40.0)

## 2022-06-10 LAB — HEPATIC FUNCTION PANEL
ALT: 26 U/L (ref 0–53)
AST: 21 U/L (ref 0–37)
Albumin: 5.1 g/dL (ref 3.5–5.2)
Alkaline Phosphatase: 38 U/L — ABNORMAL LOW (ref 39–117)
Bilirubin, Direct: 0.1 mg/dL (ref 0.0–0.3)
Total Bilirubin: 0.6 mg/dL (ref 0.2–1.2)
Total Protein: 8.1 g/dL (ref 6.0–8.3)

## 2022-06-10 LAB — BASIC METABOLIC PANEL
BUN: 13 mg/dL (ref 6–23)
CO2: 30 mEq/L (ref 19–32)
Calcium: 10.3 mg/dL (ref 8.4–10.5)
Chloride: 100 mEq/L (ref 96–112)
Creatinine, Ser: 0.99 mg/dL (ref 0.40–1.50)
GFR: 85.62 mL/min (ref >60.00)
Glucose, Bld: 93 mg/dL (ref 70–99)
Potassium: 4.8 mEq/L (ref 3.5–5.1)
Sodium: 139 mEq/L (ref 135–145)

## 2022-06-10 MED ORDER — ROSUVASTATIN CALCIUM 20 MG PO TABS: 20.0000 mg | ORAL_TABLET | Freq: Every day | ORAL | 3 refills | Status: DC

## 2022-06-10 MED ORDER — LISINOPRIL 20 MG PO TABS: 20.0000 mg | ORAL_TABLET | Freq: Every day | ORAL | 0 refills | Status: DC

## 2022-06-10 MED ORDER — PROPRANOLOL HCL ER 80 MG PO CP24: 80.0000 mg | ORAL_CAPSULE | Freq: Every day | ORAL | 3 refills | Status: DC

## 2022-06-10 NOTE — Progress Notes (Signed)
Established Patient Office Visit  Subjective   Patient ID: Phillip Gay, male    DOB: Jan 17, 1966  Age: 56 y.o. MRN: [id number]  Chief Complaint  Patient presents with   Follow-up    Routine follow up, no concerns. Patient fasting. Refill on medications.     HPI follow-up of hypertension with intermittent palpitations, elevated cholesterol, and some mild fatigue.  Patient has been exercising for an hour at least 4 times weekly.  He has been able to lose some weight.  Libido has been good.  He has not seen an increase in muscle bulk after exercising and wonders about this.  Blood pressure well controlled with lisinopril and propranolol.  He is experienced no palpitations.  Cholesterol continues to be well controlled with Crestor.  He has decreased the fat in his diet.  He rarely drinks alcohol.    Review of Systems  Constitutional: Negative.   HENT: Negative.    Eyes:  Negative for blurred vision, discharge and redness.  Respiratory: Negative.    Cardiovascular: Negative.   Gastrointestinal:  Negative for abdominal pain.  Genitourinary: Negative.   Musculoskeletal: Negative.  Negative for myalgias.  Skin:  Negative for rash.  Neurological:  Negative for tingling, loss of consciousness and weakness.  Endo/Heme/Allergies:  Negative for polydipsia.      Objective:     BP 110/68 (BP Location: Right Arm, Patient Position: Sitting, Cuff Size: Normal)   Pulse (!) 57   Temp (!) 97.3 F (36.3 C) (Temporal)   Ht 5\' 11"  (1.803 m)   Wt 180 lb 6.4 oz (81.8 kg)   SpO2 97%   BMI 25.16 kg/m  BP Readings from Last 3 Encounters:  06/10/22 110/68  05/28/22 110/80  12/13/21 116/78   Wt Readings from Last 3 Encounters:  06/10/22 180 lb 6.4 oz (81.8 kg)  05/28/22 182 lb (82.6 kg)  12/13/21 183 lb (83 kg)      Physical Exam Constitutional:      General: He is not in acute distress.    Appearance: Normal appearance. He is not ill-appearing, toxic-appearing or diaphoretic.   HENT:     Head: Normocephalic and atraumatic.     Right Ear: External ear normal.     Left Ear: External ear normal.     Mouth/Throat:     Mouth: Mucous membranes are moist.     Pharynx: Oropharynx is clear. No oropharyngeal exudate or posterior oropharyngeal erythema.  Eyes:     General: No scleral icterus.       Right eye: No discharge.        Left eye: No discharge.     Extraocular Movements: Extraocular movements intact.     Conjunctiva/sclera: Conjunctivae normal.     Pupils: Pupils are equal, round, and reactive to light.  Neck:     Vascular: No carotid bruit.  Cardiovascular:     Rate and Rhythm: Normal rate and regular rhythm.  Pulmonary:     Effort: Pulmonary effort is normal. No respiratory distress.     Breath sounds: Normal breath sounds.  Abdominal:     General: Bowel sounds are normal.  Musculoskeletal:     Cervical back: No rigidity or tenderness.  Lymphadenopathy:     Cervical: No cervical adenopathy.  Skin:    General: Skin is warm and dry.  Neurological:     Mental Status: He is alert and oriented to person, place, and time.  Psychiatric:        Mood and Affect: Mood  normal.        Behavior: Behavior normal.      No results found for any visits on 06/10/22.    The 10-year ASCVD risk score (Arnett DK, et al., 2019) is: 4.4%    Assessment & Plan:   Problem List Items Addressed This Visit       Cardiovascular and Mediastinum   Essential (primary) hypertension - Primary   Relevant Medications   lisinopril (ZESTRIL) 20 MG tablet   rosuvastatin (CRESTOR) 20 MG tablet   propranolol ER (INDERAL LA) 80 MG 24 hr capsule   Other Relevant Orders   Basic metabolic panel     Other   Palpitations   Relevant Medications   propranolol ER (INDERAL LA) 80 MG 24 hr capsule   Mixed hyperlipidemia   Relevant Medications   lisinopril (ZESTRIL) 20 MG tablet   rosuvastatin (CRESTOR) 20 MG tablet   propranolol ER (INDERAL LA) 80 MG 24 hr capsule   Other  Relevant Orders   Hepatic function panel   Lipid panel   Need for influenza vaccination   Relevant Orders   Flu Vaccine QUAD 6+ mos PF IM (Fluarix Quad PF) (Completed)   Other fatigue   Relevant Orders   Testosterone Total,Free,Bio, Males-([facility])    Return in about 1 year (around 06/11/2023), or if symptoms worsen or fail to improve.  Hopefully liver enzymes continue to be normal and here for follow-up would be appropriate.  At this point patient is mostly interested to see exactly what his testosterone levels are.  He is not necessarily interested in treatment.  Libby Maw, MD

## 2022-06-11 LAB — TESTOSTERONE TOTAL,FREE,BIO, MALES
Albumin: 5.3 g/dL — ABNORMAL HIGH (ref 3.6–5.1)
Sex Hormone Binding: 38 nmol/L (ref 10–50)
Testosterone, Bioavailable: 82.6 ng/dL — ABNORMAL LOW (ref 110.0–575.0)
Testosterone, Free: 34.4 pg/mL — ABNORMAL LOW (ref 46.0–224.0)
Testosterone: 316 ng/dL (ref 250–827)

## 2022-06-12 ENCOUNTER — Encounter: Payer: Self-pay | Admitting: Family Medicine

## 2022-06-14 ENCOUNTER — Ambulatory Visit (HOSPITAL_BASED_OUTPATIENT_CLINIC_OR_DEPARTMENT_OTHER): Payer: Commercial Managed Care - HMO

## 2022-06-17 ENCOUNTER — Telehealth (INDEPENDENT_AMBULATORY_CARE_PROVIDER_SITE_OTHER): Payer: Commercial Managed Care - HMO | Admitting: Family Medicine

## 2022-06-17 ENCOUNTER — Encounter: Payer: Self-pay | Admitting: Family Medicine

## 2022-06-17 VITALS — Ht 71.0 in

## 2022-06-17 DIAGNOSIS — E291 Testicular hypofunction: Secondary | ICD-10-CM

## 2022-06-17 MED ORDER — TESTOSTERONE CYPIONATE 200 MG/ML IM SOLN: 200.0000 mg | INTRAMUSCULAR | 1 refills | Status: AC

## 2022-06-17 NOTE — Progress Notes (Signed)
Established Patient Office Visit  Subjective   Patient ID: Phillip Gay, male    DOB: 10/29/1965  Age: 56 y.o. MRN: [id number]  Chief Complaint  Patient presents with   Advice Only    Discuss testosterone levels and treatment     HPI for discussion androgen deficiency and possible treatment.  Total testosterone was low normal.  Free and bio available levels were low.  Patient admits to some ongoing fatigue.  He has been working out and not been able to build any muscle mass.  Libido is okay.  He is retired and his wife works as a Water quality scientist.    Review of Systems  Constitutional:  Positive for malaise/fatigue.  HENT: Negative.    Eyes:  Negative for blurred vision, discharge and redness.  Respiratory: Negative.    Cardiovascular: Negative.   Gastrointestinal:  Negative for abdominal pain.  Genitourinary: Negative.   Musculoskeletal: Negative.  Negative for myalgias.  Skin:  Negative for rash.  Neurological:  Negative for tingling, loss of consciousness and weakness.  Endo/Heme/Allergies:  Negative for polydipsia.      Objective:     Ht 5\' 11"  (1.803 m)   BMI 25.16 kg/m    Physical Exam Constitutional:      General: He is not in acute distress.    Appearance: Normal appearance. He is not ill-appearing, toxic-appearing or diaphoretic.  HENT:     Head: Normocephalic and atraumatic.     Right Ear: External ear normal.     Left Ear: External ear normal.  Eyes:     General: No scleral icterus.       Right eye: No discharge.        Left eye: No discharge.     Extraocular Movements: Extraocular movements intact.     Conjunctiva/sclera: Conjunctivae normal.  Pulmonary:     Effort: Pulmonary effort is normal. No respiratory distress.  Skin:    General: Skin is warm and dry.  Neurological:     Mental Status: He is alert and oriented to person, place, and time.  Psychiatric:        Mood and Affect: Mood normal.        Behavior: Behavior normal.      No  results found for any visits on 06/17/22.    The 10-year ASCVD risk score (Arnett DK, et al., 2019) is: 4.5%    Assessment & Plan:   Problem List Items Addressed This Visit   None Visit Diagnoses     Androgen deficiency    -  Primary   Relevant Medications   testosterone cypionate (DEPOTESTOSTERONE CYPIONATE) 200 MG/ML injection       Return in about 3 months (around 09/16/2022).   We will pick up his prescription and make a nurse visit.  His wife can accompany him and learn how to do the injections.  Discussed the importance of following his hemoglobin and prostate health.  Advised him to give it at least 3 months.  He may discontinue it does not feel as though it is helping.  Follow-up in 3 months for recheck. 09/18/2022, MD  Virtual Visit via Video Note  I connected with Phillip Gay on 06/17/22 at  2:00 PM EDT by a video enabled telemedicine application and verified that I am speaking with the correct person using two identifiers.  Location: Patient: home with his wife and dog.  Provider: work   I discussed the limitations of evaluation and management by telemedicine and the  availability of in person appointments. The patient expressed understanding and agreed to proceed.  History of Present Illness:    Observations/Objective:   Assessment and Plan:   Follow Up Instructions:    I discussed the assessment and treatment plan with the patient. The patient was provided an opportunity to ask questions and all were answered. The patient agreed with the plan and demonstrated an understanding of the instructions.   The patient was advised to call back or seek an in-person evaluation if the symptoms worsen or if the condition fails to improve as anticipated.  I provided 25 minutes of non-face-to-face time during this encounter.   Libby Maw, MD

## 2022-06-19 ENCOUNTER — Ambulatory Visit (INDEPENDENT_AMBULATORY_CARE_PROVIDER_SITE_OTHER): Payer: Commercial Managed Care - HMO

## 2022-06-19 DIAGNOSIS — E291 Testicular hypofunction: Secondary | ICD-10-CM

## 2022-06-19 MED ORDER — TESTOSTERONE CYPIONATE 200 MG/ML IM SOLN
200.0000 mg | INTRAMUSCULAR | Status: AC
  Administered 2022-06-19: 200 mg via INTRAMUSCULAR

## 2022-06-19 NOTE — Progress Notes (Signed)
Per orders of Dr. Ethelene Hal pt is here for Testosterone Injection. Pt received Testosterone injection in right upper outter Quadrant of the glute  at 1:55 pm. Given by [place] L. CMA/CPT,  Pt tolerated Testosterone Injection well.   Pt and wife here for education on how to give testosterone injections at home, both instructed to use 18x1 g needle to draw up medication 23x1 1/2 g to inject medication using a 81mL syringe to house medication. Discarding of vial and needles after each use. Both verbalized understanding. Instructed to call if they have any questions.

## 2022-07-08 ENCOUNTER — Encounter: Payer: Self-pay | Admitting: Family Medicine

## 2022-09-04 ENCOUNTER — Other Ambulatory Visit: Payer: Self-pay | Admitting: Family Medicine

## 2022-09-04 DIAGNOSIS — I10 Essential (primary) hypertension: Secondary | ICD-10-CM

## 2022-09-12 ENCOUNTER — Ambulatory Visit (INDEPENDENT_AMBULATORY_CARE_PROVIDER_SITE_OTHER): Payer: Commercial Managed Care - HMO | Admitting: Family Medicine

## 2022-09-12 ENCOUNTER — Encounter: Payer: Self-pay | Admitting: Family Medicine

## 2022-09-12 VITALS — BP 136/84 | HR 61 | Temp 97.7°F | Ht 71.0 in | Wt 187.4 lb

## 2022-09-12 DIAGNOSIS — I10 Essential (primary) hypertension: Secondary | ICD-10-CM

## 2022-09-12 DIAGNOSIS — T887XXA Unspecified adverse effect of drug or medicament, initial encounter: Secondary | ICD-10-CM

## 2022-09-12 DIAGNOSIS — E291 Testicular hypofunction: Secondary | ICD-10-CM | POA: Diagnosis not present

## 2022-09-12 LAB — CBC
HCT: 52.4 % — ABNORMAL HIGH (ref 39.0–52.0)
Hemoglobin: 17.9 g/dL — ABNORMAL HIGH (ref 13.0–17.0)
MCHC: 34.2 g/dL (ref 30.0–36.0)
MCV: 99.6 fl (ref 78.0–100.0)
Platelets: 254 10*3/uL (ref 150.0–400.0)
RBC: 5.26 Mil/uL (ref 4.22–5.81)
RDW: 13.4 % (ref 11.5–15.5)
WBC: 8.9 10*3/uL (ref 4.0–10.5)

## 2022-09-12 NOTE — Progress Notes (Signed)
Established Patient Office Visit   Subjective:  Patient ID: Phillip Gay, male    DOB: [birth year]  Age: 56 y.o. MRN: [id number]  Chief Complaint  Patient presents with   Follow-up    Follow up after starting testosterone injections.     HPI Encounter Diagnoses  Name Primary?   Androgen deficiency Yes   Essential (primary) hypertension    Medication side effect    Follow-up of hypertension and androgen deficiency.  Continues with lisinopril 20 mg daily and propranolol ER 80 mg daily.  He is taking 100 mg of the testosterone weekly.  This is working better for him.  He has noticed an increase in energy and libido.  Would like to continue it.   Review of Systems  Constitutional: Negative.   HENT: Negative.    Eyes:  Negative for blurred vision, discharge and redness.  Respiratory: Negative.    Cardiovascular: Negative.   Gastrointestinal:  Negative for abdominal pain.  Genitourinary: Negative.   Musculoskeletal: Negative.  Negative for myalgias.  Skin:  Negative for rash.  Neurological:  Negative for tingling, loss of consciousness and weakness.  Endo/Heme/Allergies:  Negative for polydipsia.     Current Outpatient Medications:    lisinopril (ZESTRIL) 20 MG tablet, TAKE 1 TABLET BY MOUTH EVERY DAY, Disp: 30 tablet, Rfl: 2   Multiple Vitamins-Minerals (MULTIVITAL PO), Take 1 tablet by mouth daily. Unknown strength, Disp: , Rfl:    propranolol ER (INDERAL LA) 80 MG 24 hr capsule, Take 1 capsule (80 mg total) by mouth daily., Disp: 90 capsule, Rfl: 3   rosuvastatin (CRESTOR) 20 MG tablet, Take 1 tablet (20 mg total) by mouth daily., Disp: 90 tablet, Rfl: 3   testosterone cypionate (DEPOTESTOSTERONE CYPIONATE) 200 MG/ML injection, Inject 1 mL (200 mg total) into the muscle every 14 (fourteen) days. Needs 8 3 cc syringes with 18-gauge needles for drawing.  Needs 8 23 1-1/2 inch needles for injection., Disp: 10 mL, Rfl: 1  Current Facility-Administered Medications:     testosterone cypionate (DEPOTESTOSTERONE CYPIONATE) injection 200 mg, 200 mg, Intramuscular, Q14 Days, [name], MD, 200 mg at 06/19/22 1355   Objective:     BP 136/84 (BP Location: Left Arm, Patient Position: Sitting, Cuff Size: Normal)   Pulse 61   Temp 97.7 F (36.5 C) (Temporal)   Ht 5\' 11"  (1.803 m)   Wt 187 lb 6.4 oz (85 kg)   SpO2 97%   BMI 26.14 kg/m  BP Readings from Last 3 Encounters:  09/12/22 136/84  06/10/22 110/68  05/28/22 110/80   Wt Readings from Last 3 Encounters:  09/12/22 187 lb 6.4 oz (85 kg)  06/10/22 180 lb 6.4 oz (81.8 kg)  05/28/22 182 lb (82.6 kg)      Physical Exam Constitutional:      General: He is not in acute distress.    Appearance: Normal appearance. He is not ill-appearing, toxic-appearing or diaphoretic.  HENT:     Head: Normocephalic and atraumatic.     Right Ear: External ear normal.     Left Ear: External ear normal.  Eyes:     General: No scleral icterus.       Right eye: No discharge.        Left eye: No discharge.     Extraocular Movements: Extraocular movements intact.     Conjunctiva/sclera: Conjunctivae normal.  Cardiovascular:     Rate and Rhythm: Normal rate and regular rhythm.  Pulmonary:     Effort: Pulmonary effort is  normal. No respiratory distress.     Breath sounds: Normal breath sounds.  Skin:    General: Skin is warm and dry.  Neurological:     Mental Status: He is alert and oriented to person, place, and time.  Psychiatric:        Mood and Affect: Mood normal.        Behavior: Behavior normal.      No results found for any visits on 09/12/22.    The 10-year ASCVD risk score (Arnett DK, et al., 2019) is: 7.2%    Assessment & Plan:   Androgen deficiency  Essential (primary) hypertension  Medication side effect -     CBC    Return in about 3 months (around 12/12/2022).  Due for physical exam in 3 months.  Continue exercise physical activity.  Continue lisinopril and Inderal for  hypertension.  Checking hemoglobin today.  [name], MD

## 2022-09-18 ENCOUNTER — Ambulatory Visit: Payer: Commercial Managed Care - HMO | Admitting: Family Medicine

## 2022-11-01 ENCOUNTER — Telehealth: Payer: Self-pay

## 2022-11-01 NOTE — Telephone Encounter (Signed)
Pharmacy Patient Advocate Encounter  Received notification from [facility] that the request for prior authorization for Testosterone has been denied due to .

## 2022-11-23 ENCOUNTER — Other Ambulatory Visit: Payer: Self-pay | Admitting: Family Medicine

## 2022-11-23 DIAGNOSIS — I10 Essential (primary) hypertension: Secondary | ICD-10-CM

## 2022-12-19 ENCOUNTER — Other Ambulatory Visit: Payer: Self-pay | Admitting: Family Medicine

## 2022-12-19 DIAGNOSIS — E291 Testicular hypofunction: Secondary | ICD-10-CM

## 2023-01-06 ENCOUNTER — Encounter: Payer: Self-pay | Admitting: *Deleted

## 2023-05-19 ENCOUNTER — Other Ambulatory Visit: Payer: Self-pay | Admitting: Family Medicine

## 2023-05-19 DIAGNOSIS — I10 Essential (primary) hypertension: Secondary | ICD-10-CM

## 2023-05-23 ENCOUNTER — Other Ambulatory Visit: Payer: Self-pay | Admitting: Family Medicine

## 2023-05-23 DIAGNOSIS — R002 Palpitations: Secondary | ICD-10-CM

## 2023-05-23 DIAGNOSIS — E782 Mixed hyperlipidemia: Secondary | ICD-10-CM

## 2023-05-23 DIAGNOSIS — I10 Essential (primary) hypertension: Secondary | ICD-10-CM

## 2023-06-15 ENCOUNTER — Other Ambulatory Visit: Payer: Self-pay | Admitting: Family Medicine

## 2023-06-15 DIAGNOSIS — E782 Mixed hyperlipidemia: Secondary | ICD-10-CM

## 2023-06-15 DIAGNOSIS — R002 Palpitations: Secondary | ICD-10-CM

## 2023-06-15 DIAGNOSIS — I10 Essential (primary) hypertension: Secondary | ICD-10-CM

## 2023-08-16 ENCOUNTER — Other Ambulatory Visit: Payer: Self-pay | Admitting: Family Medicine

## 2023-08-16 DIAGNOSIS — I10 Essential (primary) hypertension: Secondary | ICD-10-CM
